# Patient Record
Sex: Female | Born: 1985
Health system: Southern US, Community
[De-identification: ages and names within clinical notes are randomized; demographics above are authoritative.]

## PROBLEM LIST (undated history)

## (undated) ENCOUNTER — Inpatient Hospital Stay (HOSPITAL_COMMUNITY): Payer: Self-pay

## (undated) DIAGNOSIS — T8859XA Other complications of anesthesia, initial encounter: Secondary | ICD-10-CM

## (undated) DIAGNOSIS — O24419 Gestational diabetes mellitus in pregnancy, unspecified control: Secondary | ICD-10-CM

## (undated) DIAGNOSIS — F329 Major depressive disorder, single episode, unspecified: Secondary | ICD-10-CM

## (undated) DIAGNOSIS — F419 Anxiety disorder, unspecified: Secondary | ICD-10-CM

## (undated) DIAGNOSIS — Z8489 Family history of other specified conditions: Secondary | ICD-10-CM

## (undated) DIAGNOSIS — T4145XA Adverse effect of unspecified anesthetic, initial encounter: Secondary | ICD-10-CM

## (undated) DIAGNOSIS — N159 Renal tubulo-interstitial disease, unspecified: Secondary | ICD-10-CM

## (undated) DIAGNOSIS — F32A Depression, unspecified: Secondary | ICD-10-CM

## (undated) HISTORY — PX: MOUTH SURGERY: SHX715

## (undated) HISTORY — DX: Depression, unspecified: F32.A

## (undated) HISTORY — DX: Major depressive disorder, single episode, unspecified: F32.9

## (undated) HISTORY — PX: DILATION AND CURETTAGE OF UTERUS: SHX78

---
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
## 2009-04-13 ENCOUNTER — Inpatient Hospital Stay (HOSPITAL_COMMUNITY): Admission: AD | Admit: 2009-04-13 | Discharge: 2009-04-16 | Payer: Self-pay | Admitting: Obstetrics and Gynecology

## 2009-04-23 ENCOUNTER — Inpatient Hospital Stay (HOSPITAL_COMMUNITY): Admission: AD | Admit: 2009-04-23 | Discharge: 2009-04-23 | Payer: Self-pay | Admitting: Obstetrics and Gynecology

## 2009-04-26 ENCOUNTER — Inpatient Hospital Stay (HOSPITAL_COMMUNITY): Admission: AD | Admit: 2009-04-26 | Discharge: 2009-04-27 | Payer: Self-pay | Admitting: Obstetrics and Gynecology

## 2009-04-27 ENCOUNTER — Encounter: Payer: Self-pay | Admitting: Obstetrics and Gynecology

## 2009-04-28 ENCOUNTER — Inpatient Hospital Stay (HOSPITAL_COMMUNITY): Admission: AD | Admit: 2009-04-28 | Discharge: 2009-05-04 | Payer: Self-pay | Admitting: Obstetrics & Gynecology

## 2009-04-30 ENCOUNTER — Encounter (INDEPENDENT_AMBULATORY_CARE_PROVIDER_SITE_OTHER): Payer: Self-pay | Admitting: Obstetrics & Gynecology

## 2010-05-25 ENCOUNTER — Emergency Department (HOSPITAL_COMMUNITY)
Admission: EM | Admit: 2010-05-25 | Discharge: 2010-05-25 | Payer: Self-pay | Source: Home / Self Care | Admitting: Family Medicine

## 2010-08-20 LAB — CBC
HCT: 28.1 % — ABNORMAL LOW (ref 36.0–46.0)
HCT: 28.7 % — ABNORMAL LOW (ref 36.0–46.0)
HCT: 37 % (ref 36.0–46.0)
Hemoglobin: 12.6 g/dL (ref 12.0–15.0)
Hemoglobin: 13.3 g/dL (ref 12.0–15.0)
Hemoglobin: 9.7 g/dL — ABNORMAL LOW (ref 12.0–15.0)
Hemoglobin: 9.9 g/dL — ABNORMAL LOW (ref 12.0–15.0)
MCHC: 33.7 g/dL (ref 30.0–36.0)
MCHC: 34.4 g/dL (ref 30.0–36.0)
MCHC: 34.6 g/dL (ref 30.0–36.0)
MCV: 91.9 fL (ref 78.0–100.0)
MCV: 92.3 fL (ref 78.0–100.0)
MCV: 92.6 fL (ref 78.0–100.0)
MCV: 92.7 fL (ref 78.0–100.0)
Platelets: 100 10*3/uL — ABNORMAL LOW (ref 150–400)
Platelets: 146 10*3/uL — ABNORMAL LOW (ref 150–400)
RBC: 3.04 MIL/uL — ABNORMAL LOW (ref 3.87–5.11)
RBC: 4.03 MIL/uL (ref 3.87–5.11)
RDW: 13.5 % (ref 11.5–15.5)
RDW: 13.8 % (ref 11.5–15.5)
RDW: 14.1 % (ref 11.5–15.5)
WBC: 11.9 10*3/uL — ABNORMAL HIGH (ref 4.0–10.5)

## 2010-08-20 LAB — PROTIME-INR: INR: 1.08 (ref 0.00–1.49)

## 2010-08-20 LAB — RAPID URINE DRUG SCREEN, HOSP PERFORMED
Cocaine: NOT DETECTED
Tetrahydrocannabinol: NOT DETECTED

## 2010-08-20 LAB — DIFFERENTIAL
Basophils Absolute: 0 10*3/uL (ref 0.0–0.1)
Basophils Relative: 0 % (ref 0–1)
Eosinophils Absolute: 0.1 10*3/uL (ref 0.0–0.7)
Monocytes Absolute: 0.7 10*3/uL (ref 0.1–1.0)
Monocytes Relative: 6 % (ref 3–12)
Neutro Abs: 9.3 10*3/uL — ABNORMAL HIGH (ref 1.7–7.7)

## 2010-08-21 LAB — RPR: RPR Ser Ql: NONREACTIVE

## 2010-08-21 LAB — CBC
HCT: 35.3 % — ABNORMAL LOW (ref 36.0–46.0)
MCHC: 34.2 g/dL (ref 30.0–36.0)
MCV: 92.3 fL (ref 78.0–100.0)
Platelets: 137 10*3/uL — ABNORMAL LOW (ref 150–400)
RBC: 4.02 MIL/uL (ref 3.87–5.11)
RDW: 13.5 % (ref 11.5–15.5)
RDW: 13.6 % (ref 11.5–15.5)
WBC: 9.4 10*3/uL (ref 4.0–10.5)

## 2011-02-01 ENCOUNTER — Inpatient Hospital Stay (INDEPENDENT_AMBULATORY_CARE_PROVIDER_SITE_OTHER)
Admission: RE | Admit: 2011-02-01 | Discharge: 2011-02-01 | Disposition: A | Discharge status: Home / Self Care | Payer: 59 | Source: Ambulatory Visit | Attending: Emergency Medicine | Admitting: Emergency Medicine

## 2011-02-01 DIAGNOSIS — N390 Urinary tract infection, site not specified: Secondary | ICD-10-CM

## 2011-02-01 LAB — POCT URINALYSIS DIP (DEVICE)
Glucose, UA: NEGATIVE mg/dL
Ketones, ur: NEGATIVE mg/dL
Protein, ur: 100 mg/dL — AB
Urobilinogen, UA: 1 mg/dL (ref 0.0–1.0)

## 2011-02-01 LAB — POCT PREGNANCY, URINE: Preg Test, Ur: NEGATIVE

## 2012-08-13 ENCOUNTER — Emergency Department (HOSPITAL_BASED_OUTPATIENT_CLINIC_OR_DEPARTMENT_OTHER)
Admission: EM | Admit: 2012-08-13 | Discharge: 2012-08-13 | Disposition: A | Discharge status: Home / Self Care | Payer: 59 | Attending: Emergency Medicine | Admitting: Emergency Medicine

## 2012-08-13 ENCOUNTER — Encounter (HOSPITAL_BASED_OUTPATIENT_CLINIC_OR_DEPARTMENT_OTHER): Payer: Self-pay

## 2012-08-13 DIAGNOSIS — W278XXA Contact with other nonpowered hand tool, initial encounter: Secondary | ICD-10-CM | POA: Insufficient documentation

## 2012-08-13 DIAGNOSIS — Y9389 Activity, other specified: Secondary | ICD-10-CM | POA: Insufficient documentation

## 2012-08-13 DIAGNOSIS — S61219A Laceration without foreign body of unspecified finger without damage to nail, initial encounter: Secondary | ICD-10-CM

## 2012-08-13 DIAGNOSIS — S61209A Unspecified open wound of unspecified finger without damage to nail, initial encounter: Secondary | ICD-10-CM | POA: Insufficient documentation

## 2012-08-13 DIAGNOSIS — Y929 Unspecified place or not applicable: Secondary | ICD-10-CM | POA: Insufficient documentation

## 2012-08-13 DIAGNOSIS — Z79899 Other long term (current) drug therapy: Secondary | ICD-10-CM | POA: Insufficient documentation

## 2012-08-13 NOTE — ED Provider Notes (Signed)
History     CSN: [id number]  Arrival date & time 08/13/12  1559   First MD Initiated Contact with Patient 08/13/12 1721      Chief Complaint  Patient presents with  . Finger Injury    (Consider location/radiation/quality/duration/timing/severity/associated sxs/prior treatment) Patient is a 27 y.o. female presenting with hand pain. The history is provided by the patient. No language interpreter was used.  Hand Pain This is a new problem. The current episode started today. The problem occurs constantly. The problem has been unchanged. Nothing aggravates the symptoms. She has tried nothing for the symptoms. The treatment provided no relief.  Pt has a v shaped flap to left index finger.   Pt reports her toddler reached for scizzors and she grabbed them  History reviewed. No pertinent past medical history.  Past Surgical History  Procedure Laterality Date  . Dilation and curettage of uterus    . Cesarean section    . Mouth surgery      History reviewed. No pertinent family history.  History  Substance Use Topics  . Smoking status: Never Smoker   . Smokeless tobacco: Never Used  . Alcohol Use: No    OB History   Grav Para Term Preterm Abortions TAB SAB Ect Mult Living                  Review of Systems  Skin: Positive for wound.  All other systems reviewed and are negative.    Allergies  Review of patient's allergies indicates no known allergies.  Home Medications   Current Outpatient Rx  Name  Route  Sig  Dispense  Refill  . FLUoxetine (PROZAC) 10 MG tablet   Oral   Take 10 mg by mouth daily.         Marland Kitchen levonorgestrel-ethinyl estradiol (NORDETTE) 0.15-30 MG-MCG tablet   Oral   Take 1 tablet by mouth daily.           BP 124/82  Pulse 81  Temp(Src) 98 F (36.7 C) (Oral)  Resp 16  Ht 5\' 5"  (1.651 m)  Wt 120 lb (54.432 kg)  BMI 19.97 kg/m2  SpO2 100%  LMP 07/30/2012  Physical Exam  Nursing note and vitals reviewed. Constitutional: She is  oriented to person, place, and time. She appears well-developed and well-nourished.  Musculoskeletal: She exhibits tenderness.  V shaped flap laceration distal left index finger,   Well approximated  From nv intact  Neurological: She is alert and oriented to person, place, and time. She has normal reflexes.  Skin: Skin is warm.  Psychiatric: She has a normal mood and affect.    ED Course  LACERATION REPAIR Date/Time: 08/13/2012 6:08 PM Performed by: Elson Areas Authorized by: Elson Areas Consent: Verbal consent not obtained. Risks and benefits: risks, benefits and alternatives were discussed Consent given by: patient Patient understanding: patient states understanding of the procedure being performed Required items: required blood products, implants, devices, and special equipment available Patient identity confirmed: verbally with patient Foreign bodies: no foreign bodies Irrigation solution: saline Amount of cleaning: standard Skin closure: glue Patient tolerance: Patient tolerated the procedure well with no immediate complications.   (including critical care time)  Labs Reviewed - No data to display No results found.   1. Laceration of finger of right hand, initial encounter       MDM   Pt placed in a finger splint.       Lonia Skinner Hawthorne, PA-C 08/13/12 [phone]

## 2012-08-13 NOTE — ED Notes (Signed)
Pt states that she was trying to take away some scissors from her toddler when she suffered a laceration to her L index finger.  Bleeding controlled.

## 2012-08-13 NOTE — ED Provider Notes (Signed)
Medical screening examination/treatment/procedure(s) were performed by non-physician practitioner and as supervising physician I was immediately available for consultation/collaboration.    Celene Kras, MD 08/13/12 [phone]

## 2012-09-10 ENCOUNTER — Telehealth: Payer: Self-pay | Admitting: Physician Assistant

## 2012-09-10 NOTE — Telephone Encounter (Signed)
PT DECIDED TO WAIT.NO APPT NEEDED AT THIS TIME

## 2012-09-14 ENCOUNTER — Ambulatory Visit (INDEPENDENT_AMBULATORY_CARE_PROVIDER_SITE_OTHER): Payer: 59 | Admitting: General Practice

## 2012-09-14 ENCOUNTER — Encounter: Payer: Self-pay | Admitting: General Practice

## 2012-09-14 ENCOUNTER — Telehealth: Payer: Self-pay | Admitting: Physician Assistant

## 2012-09-14 ENCOUNTER — Other Ambulatory Visit: Payer: Self-pay | Admitting: General Practice

## 2012-09-14 VITALS — BP 111/68 | HR 96 | Temp 97.6°F | Ht 65.0 in | Wt 125.0 lb

## 2012-09-14 DIAGNOSIS — H109 Unspecified conjunctivitis: Secondary | ICD-10-CM

## 2012-09-14 DIAGNOSIS — J020 Streptococcal pharyngitis: Secondary | ICD-10-CM

## 2012-09-14 DIAGNOSIS — J029 Acute pharyngitis, unspecified: Secondary | ICD-10-CM

## 2012-09-14 LAB — POCT RAPID STREP A (OFFICE): Rapid Strep A Screen: NEGATIVE

## 2012-09-14 MED ORDER — CIPROFLOXACIN HCL 0.3 % OP SOLN: 1.0000 [drp] | OPHTHALMIC | Status: DC

## 2012-09-14 MED ORDER — AMOXICILLIN 500 MG PO CAPS: 500.0000 mg | ORAL_CAPSULE | Freq: Two times a day (BID) | ORAL | Status: DC

## 2012-09-14 NOTE — Progress Notes (Signed)
  Subjective:    Patient ID: Amy Jackson, female    DOB: 26-Jun-1985, 27 y.o.   MRN: [id number]  Conjunctivitis  The current episode started 5 to 7 days ago. The onset was sudden. The problem occurs continuously. The problem has been gradually improving. The problem is moderate. Nothing relieves the symptoms. Nothing aggravates the symptoms. Associated symptoms include eye itching, ear pain, rhinorrhea, sore throat, muscle aches, cough, eye discharge and eye redness. Pertinent negatives include no fever, no decreased vision, no double vision and no ear discharge. There is no color change associated with the cough. Cough worsened by: in the morning. She has been experiencing a mild sore throat. Neither side is more painful than the other. The sore throat is characterized by pain only. She has been behaving normally. She has been eating and drinking normally. Urine output has been normal. There were sick contacts at work.      Review of Systems  Constitutional: Negative for fever.  HENT: Positive for ear pain, sore throat and rhinorrhea. Negative for ear discharge.   Eyes: Positive for discharge, redness and itching. Negative for double vision.  Respiratory: Positive for cough.   Cardiovascular: Negative for chest pain and palpitations.  Genitourinary: Negative for dysuria and difficulty urinating.       Objective:   Physical Exam  Constitutional: She is oriented to person, place, and time. She appears well-developed and well-nourished.  HENT:  Nose: Right sinus exhibits no maxillary sinus tenderness and no frontal sinus tenderness. Left sinus exhibits no maxillary sinus tenderness and no frontal sinus tenderness.  Mouth/Throat: Posterior oropharyngeal erythema present.  Eyes: Left conjunctiva is injected.  Cardiovascular: Normal rate, regular rhythm and normal heart sounds.   No murmur heard. Pulmonary/Chest: Effort normal and breath sounds normal. No respiratory distress. She exhibits  no tenderness.  Neurological: She is alert and oriented to person, place, and time.  Skin: Skin is warm and dry.  Psychiatric: She has a normal mood and affect.          Assessment & Plan:  Use medications as prescribed Precautions discussed to prevent spreading Increase fluid intake Motrin or tylenol OTC OTC decongestant Throat lozenges if help New toothbrush in 3 days Proper handwashing Patient verbalized understanding Raymon Mutton, FNP-C

## 2012-09-14 NOTE — Patient Instructions (Addendum)
Conjunctivitis Conjunctivitis is commonly called "pink eye." Conjunctivitis can be caused by bacterial or viral infection, allergies, or injuries. There is usually redness of the lining of the eye, itching, discomfort, and sometimes discharge. There may be deposits of matter along the eyelids. A viral infection usually causes a watery discharge, while a bacterial infection causes a yellowish, thick discharge. Pink eye is very contagious and spreads by direct contact. You may be given antibiotic eyedrops as part of your treatment. Before using your eye medicine, remove all drainage from the eye by washing gently with warm water and cotton balls. Continue to use the medication until you have awakened 2 mornings in a row without discharge from the eye. Do not rub your eye. This increases the irritation and helps spread infection. Use separate towels from other household members. Wash your hands with soap and water before and after touching your eyes. Use cold compresses to reduce pain and sunglasses to relieve irritation from light. Do not wear contact lenses or wear eye makeup until the infection is gone. SEEK MEDICAL CARE IF:   Your symptoms are not better after 3 days of treatment.  You have increased pain or trouble seeing.  The outer eyelids become very red or swollen. Document Released: 06/12/2004 Document Revised: 07/28/2011 Document Reviewed: 05/05/2005 [facility] Patient Information 2013 [place], [place]. Sore Throat Sore throats may be caused by bacteria and viruses. They may also be caused by:  Smoking.  Pollution.  Allergies. If a sore throat is due to strep infection (a bacterial infection), you may need:  A throat swab.  A culture test to verify the strep infection. You will need one of these:  An antibiotic shot.  Oral medicine for a full 10 days. Strep infection is very contagious. A doctor should check any close contacts who have a sore throat or fever. A sore throat caused  by a virus infection will usually last only 3-4 days. Antibiotics will not treat a viral sore throat.  Infectious mononucleosis (a viral disease), however, can cause a sore throat that lasts for up to 3 weeks. Mononucleosis can be diagnosed with blood tests. You must have been sick for at least 1 week in order for the test to give accurate results. HOME CARE INSTRUCTIONS   To treat a sore throat, take mild pain medicine.  Increase your fluids.  Eat a soft diet.  Do not smoke.  Gargling with warm water or salt water (1 tsp. salt in 8 oz. water) can be helpful.  Try throat sprays or lozenges or sucking on hard candy to ease the symptoms. Call your doctor if your sore throat lasts longer than 1 week.  SEEK IMMEDIATE MEDICAL CARE IF:  You have difficulty breathing.  You have increased swelling in the throat.  You have pain so severe that you are unable to swallow fluids or your saliva.  You have a severe headache, a high fever, vomiting, or a red rash. Document Released: 06/12/2004 Document Revised: 07/28/2011 Document Reviewed: 04/22/2007 [facility] Patient Information 2013 [place], [place].

## 2012-09-14 NOTE — Telephone Encounter (Signed)
APPT MADE

## 2013-02-21 ENCOUNTER — Other Ambulatory Visit: Payer: Self-pay | Admitting: Obstetrics and Gynecology

## 2013-03-18 ENCOUNTER — Ambulatory Visit: Payer: 59 | Admitting: Family Medicine

## 2013-04-08 ENCOUNTER — Ambulatory Visit: Payer: 59 | Admitting: Family Medicine

## 2013-05-17 ENCOUNTER — Emergency Department (HOSPITAL_COMMUNITY)
Admission: EM | Admit: 2013-05-17 | Discharge: 2013-05-17 | Disposition: A | Discharge status: Home / Self Care | Payer: 59 | Source: Home / Self Care | Attending: Family Medicine | Admitting: Family Medicine

## 2013-05-17 ENCOUNTER — Encounter (HOSPITAL_COMMUNITY): Payer: Self-pay | Admitting: Emergency Medicine

## 2013-05-17 DIAGNOSIS — J039 Acute tonsillitis, unspecified: Secondary | ICD-10-CM

## 2013-05-17 LAB — POCT RAPID STREP A: Streptococcus, Group A Screen (Direct): NEGATIVE

## 2013-05-17 MED ORDER — AMOXICILLIN-POT CLAVULANATE 875-125 MG PO TABS: 1.0000 | ORAL_TABLET | Freq: Two times a day (BID) | ORAL | Status: DC

## 2013-05-17 NOTE — ED Provider Notes (Signed)
Amy Jackson is a 27 y.o. female who presents to Urgent Care today for 3 days of sore throat associated with white plaques. Pain is moderate. Patient has not tried any medications yet. She notes a mild runny nose but denies any cough or congestion. No nausea vomiting or diarrhea   Past Medical History  Diagnosis Date  . Depression    History  Substance Use Topics  . Smoking status: Never Smoker   . Smokeless tobacco: Never Used  . Alcohol Use: No   ROS as above Medications reviewed. No current facility-administered medications for this encounter.   Current Outpatient Prescriptions  Medication Sig Dispense Refill  . amoxicillin-clavulanate (AUGMENTIN) 875-125 MG per tablet Take 1 tablet by mouth every 12 (twelve) hours.  14 tablet  0  . FLUoxetine (PROZAC) 10 MG tablet Take 10 mg by mouth daily.      Marland Kitchen levonorgestrel-ethinyl estradiol (NORDETTE) 0.15-30 MG-MCG tablet Take 1 tablet by mouth daily.        Exam:  BP 123/76  Pulse 93  Temp(Src) 98 F (36.7 C) (Oral)  Resp 18  SpO2 100% Gen: Well NAD HEENT: EOMI,  MMM left tonsil with exudate and inflammation. Slight deviation of the uvula. No trismus or hot potato voice present.  No significant lymphadenopathy. Lungs: Normal work of breathing. CTABL Heart: RRR no MRG Abd: NABS, Soft. NT, ND Exts: , warm and well perfused.   Results for orders placed during the hospital encounter of 05/17/13 (from the past 24 hour(s))  POCT RAPID STREP A ([facility] URG CARE ONLY)     Status: None   Collection Time    05/17/13  5:46 PM      Result Value Range   Streptococcus, Group A Screen (Direct) NEGATIVE  NEGATIVE   No results found.  Assessment and Plan: 27 y.o. female with tonsillitis. Patient may have very early peritonsillar abscess. He does not appear to be drainable and patient does not have any concerning physical exam findings such as complete deviation of the uvula, or trismus or hot potato voice or difficulty breathing. Plan to  treat with Augmentin. Recommend followup with [facility] ear nose and throat if not improving. Present to the emergency room for worsening. Discussed warning signs or symptoms. Please see discharge instructions. Patient expresses understanding.       Rodolph Bong, MD 05/17/13 1910

## 2013-05-17 NOTE — ED Notes (Signed)
Sore throat. Tonsils reddened, swollen, uvula deviated to left side ; NAD

## 2013-05-19 LAB — CULTURE, GROUP A STREP

## 2013-07-20 ENCOUNTER — Telehealth: Payer: Self-pay | Admitting: Family Medicine

## 2013-07-20 NOTE — Telephone Encounter (Signed)
Pruritic rash on abd and sinusitis.  Appt scheduled for tomorrow. Patient aware.

## 2013-07-21 ENCOUNTER — Encounter: Payer: Self-pay | Admitting: Nurse Practitioner

## 2013-07-21 ENCOUNTER — Ambulatory Visit (INDEPENDENT_AMBULATORY_CARE_PROVIDER_SITE_OTHER): Payer: 59 | Admitting: Nurse Practitioner

## 2013-07-21 VITALS — BP 115/65 | HR 94 | Temp 98.6°F | Ht 65.0 in | Wt 134.0 lb

## 2013-07-21 DIAGNOSIS — K143 Hypertrophy of tongue papillae: Secondary | ICD-10-CM

## 2013-07-21 DIAGNOSIS — L259 Unspecified contact dermatitis, unspecified cause: Secondary | ICD-10-CM

## 2013-07-21 DIAGNOSIS — B370 Candidal stomatitis: Secondary | ICD-10-CM

## 2013-07-21 DIAGNOSIS — L309 Dermatitis, unspecified: Secondary | ICD-10-CM

## 2013-07-21 LAB — POCT WET PREP (WET MOUNT)
KOH WET PREP POC: POSITIVE
TRICHOMONAS WET PREP HPF POC: NEGATIVE
WBC, Wet Prep HPF POC: NEGATIVE

## 2013-07-21 MED ORDER — NYSTATIN 100000 UNIT/ML MT SUSP: 5.0000 mL | Freq: Four times a day (QID) | OROMUCOSAL | Status: DC

## 2013-07-21 MED ORDER — PREDNISONE 20 MG PO TABS: ORAL_TABLET | ORAL | Status: DC

## 2013-07-21 MED ORDER — PREDNISONE 20 MG PO TABS: 20.0000 mg | ORAL_TABLET | Freq: Every day | ORAL | Status: DC

## 2013-07-21 NOTE — Patient Instructions (Signed)
.   Tongue coating - POCT Wet Prep [facility]([place])  2. Oral pharyngeal candidiasis COntinue to brush togue at least BID - nystatin (MYCOSTATIN) 100000 UNIT/ML suspension; Take 5 mLs (500,000 Units total) by mouth 4 (four) times daily.  Dispense: 60 mL; Refill: 0  3. Dermatitis Avoid scratching Benadryl OTC for itching - predniSONE (DELTASONE) 20 MG tablet; 2 po qd X5 days  Dispense: 10 tablet; Refill: 0   Follow up prn  Mary-Margaret Daphine DeutscherMartin, FNP

## 2013-07-21 NOTE — Progress Notes (Signed)
   Subjective:    Patient ID: Amy Jackson, female    DOB: 1985-06-06, 28 y.o.   MRN: [id number]  HPI Patient in today c/o cough and congestion  That starte d 1-2 weeks ago- Has been taking OTC meds tHat has helped some- Developed a rash 2-3 days ado- very itchy    Review of Systems  Constitutional: Negative for fever and chills.  HENT: Positive for congestion and sore throat. Negative for ear pain, trouble swallowing and voice change.   Respiratory: Positive for cough (slight).   Skin: Positive for rash (around abdomen).       Objective:   Physical Exam  Constitutional: She is oriented to person, place, and time. She appears well-developed and well-nourished.  HENT:  Right Ear: External ear normal.  Left Ear: External ear normal.  Nose: Nose normal.  Mouth/Throat: Oropharynx is clear and moist.  Eyes: Pupils are equal, round, and reactive to light.  Neck: Normal range of motion. Neck supple.  Cardiovascular: Normal rate and normal heart sounds.   Pulmonary/Chest: Effort normal and breath sounds normal.  Abdominal: Soft. Bowel sounds are normal.  Lymphadenopathy:    She has no cervical adenopathy.  Neurological: She is alert and oriented to person, place, and time.  Skin: Skin is warm and dry. Rash (patchy dry rach circumfrencial around mid abdomen) noted.  Psychiatric: She has a normal mood and affect. Her behavior is normal. Judgment and thought content normal.          Assessment & Plan:  1. Tongue coating - POCT Wet Prep [facility]([place] [place])  2. Oral pharyngeal candidiasis COntinue to brush togue at least BID - nystatin (MYCOSTATIN) 100000 UNIT/ML suspension; Take 5 mLs (500,000 Units total) by mouth 4 (four) times daily.  Dispense: 60 mL; Refill: 0  3. Dermatitis Avoid scratching Benadryl OTC for itching - predniSONE (DELTASONE) 20 MG tablet; 2 po qd X5 days  Dispense: 10 tablet; Refill: 0   Follow up prn  Mary-Margaret Daphine DeutscherMartin, FNP

## 2013-07-25 ENCOUNTER — Telehealth: Payer: Self-pay | Admitting: Nurse Practitioner

## 2013-07-25 ENCOUNTER — Telehealth: Payer: Self-pay | Admitting: *Deleted

## 2013-07-25 MED ORDER — AZITHROMYCIN 1 G PO PACK: 1.0000 g | PACK | Freq: Once | ORAL | Status: DC

## 2013-07-25 NOTE — Telephone Encounter (Signed)
Left message on pt cell phone to take meds as directed and call with results

## 2013-07-25 NOTE — Telephone Encounter (Signed)
Have patient see dentist.

## 2013-07-25 NOTE — Telephone Encounter (Signed)
Please advise 

## 2013-07-25 NOTE — Telephone Encounter (Signed)
Sent in zithromax only take 2 tabkets then let me know how tongue does.

## 2013-07-25 NOTE — Telephone Encounter (Signed)
Left message on pt cell phone number that was left-- advising her to contac her dentist and to call if questions

## 2013-07-25 NOTE — Telephone Encounter (Signed)
Patient called back and stated that she would be happy to see a dentist but was wondering if you would try something else first. States that yall had discussed possible oral STD and wants to know if you will treat her for that first and then if that doesn't work then she will see the dentist. Please advise. When call is returned please leave a detailed message on voicemail because she is at work and may not be able to answer

## 2014-05-25 ENCOUNTER — Ambulatory Visit (INDEPENDENT_AMBULATORY_CARE_PROVIDER_SITE_OTHER): Payer: 59 | Admitting: Family Medicine

## 2014-05-25 ENCOUNTER — Encounter: Payer: Self-pay | Admitting: Family Medicine

## 2014-05-25 VITALS — BP 110/67 | HR 90 | Temp 97.8°F | Ht 65.0 in | Wt 129.4 lb

## 2014-05-25 DIAGNOSIS — F411 Generalized anxiety disorder: Secondary | ICD-10-CM

## 2014-05-25 DIAGNOSIS — B370 Candidal stomatitis: Secondary | ICD-10-CM

## 2014-05-25 MED ORDER — FLUOXETINE HCL 20 MG PO TABS: 20.0000 mg | ORAL_TABLET | Freq: Every day | ORAL | Status: DC

## 2014-05-25 MED ORDER — FLUCONAZOLE 150 MG PO TABS: 150.0000 mg | ORAL_TABLET | Freq: Once | ORAL | Status: DC

## 2014-05-25 MED ORDER — NYSTATIN 100000 UNIT/ML MT SUSP: 5.0000 mL | Freq: Four times a day (QID) | OROMUCOSAL | Status: DC

## 2014-05-25 NOTE — Progress Notes (Signed)
   Subjective:    Patient ID: Amy Jackson, female    DOB: 02-Dec-1985, 29 y.o.   MRN: [id number]  HPI Patient states she took an abx 8 months ago and she has been having persistent oral thrush.  She has been having panic and anxiety and wants to get back on her prozac.  Review of Systems  Constitutional: Negative for fever.  HENT: Negative for ear pain.   Eyes: Negative for discharge.  Respiratory: Negative for cough.   Cardiovascular: Negative for chest pain.  Gastrointestinal: Negative for abdominal distention.  Endocrine: Negative for polyuria.  Genitourinary: Negative for difficulty urinating.  Musculoskeletal: Negative for gait problem and neck pain.  Skin: Negative for color change and rash.  Neurological: Negative for speech difficulty and headaches.  Psychiatric/Behavioral: Negative for agitation.       Objective:    BP 110/67 mmHg  Pulse 90  Temp(Src) 97.8 F (36.6 C) (Oral)  Ht 5\' 5"  (1.651 m)  Wt 129 lb 6 oz (58.684 kg)  BMI 21.53 kg/m2  LMP 05/19/2014 (Approximate) Physical Exam  Constitutional: She is oriented to person, place, and time. She appears well-developed and well-nourished.  HENT:  Head: Normocephalic and atraumatic.  Mouth/Throat: Oropharynx is clear and moist.  Eyes: Pupils are equal, round, and reactive to light.  Neck: Normal range of motion. Neck supple.  Cardiovascular: Normal rate and regular rhythm.   No murmur heard. Pulmonary/Chest: Effort normal and breath sounds normal.  Abdominal: Soft. Bowel sounds are normal. There is no tenderness.  Neurological: She is alert and oriented to person, place, and time.  Skin: Skin is warm and dry.  Psychiatric: She has a normal mood and affect.          Assessment & Plan:     ICD-9-CM ICD-10-CM   1. Oral pharyngeal candidiasis 112.0 B37.0 fluconazole (DIFLUCAN) 150 MG tablet     nystatin (MYCOSTATIN) 100000 UNIT/ML suspension  2. GAD (generalized anxiety disorder) 300.02 F41.1 FLUoxetine  (PROZAC) 20 MG tablet     No Follow-up on file.  Deatra CanterWilliam J Oxford FNP

## 2014-06-01 ENCOUNTER — Ambulatory Visit: Payer: 59 | Admitting: Family Medicine

## 2014-08-07 LAB — OB RESULTS CONSOLE ABO/RH: RH Type: POSITIVE

## 2014-08-07 LAB — OB RESULTS CONSOLE RUBELLA ANTIBODY, IGM: Rubella: NON-IMMUNE/NOT IMMUNE

## 2014-08-07 LAB — OB RESULTS CONSOLE HIV ANTIBODY (ROUTINE TESTING): HIV: NONREACTIVE

## 2014-08-07 LAB — OB RESULTS CONSOLE ANTIBODY SCREEN: ANTIBODY SCREEN: NEGATIVE

## 2014-08-07 LAB — OB RESULTS CONSOLE RPR: RPR: NONREACTIVE

## 2014-11-22 LAB — OB RESULTS CONSOLE RPR: RPR: NONREACTIVE

## 2015-01-30 ENCOUNTER — Other Ambulatory Visit: Payer: Self-pay | Admitting: Obstetrics and Gynecology

## 2015-02-13 ENCOUNTER — Encounter (HOSPITAL_COMMUNITY): Payer: Self-pay

## 2015-02-14 ENCOUNTER — Encounter (HOSPITAL_COMMUNITY)
Admission: RE | Admit: 2015-02-14 | Discharge: 2015-02-14 | Disposition: A | Discharge status: Home / Self Care | Payer: 59 | Source: Ambulatory Visit | Attending: Obstetrics and Gynecology | Admitting: Obstetrics and Gynecology

## 2015-02-14 ENCOUNTER — Encounter (HOSPITAL_COMMUNITY): Payer: Self-pay

## 2015-02-14 HISTORY — DX: Family history of other specified conditions: Z84.89

## 2015-02-14 HISTORY — DX: Other complications of anesthesia, initial encounter: T88.59XA

## 2015-02-14 HISTORY — DX: Adverse effect of unspecified anesthetic, initial encounter: T41.45XA

## 2015-02-14 LAB — CBC
HCT: 34.3 % — ABNORMAL LOW (ref 36.0–46.0)
HEMOGLOBIN: 11.5 g/dL — AB (ref 12.0–15.0)
MCH: 27.7 pg (ref 26.0–34.0)
MCHC: 33.5 g/dL (ref 30.0–36.0)
MCV: 82.7 fL (ref 78.0–100.0)
PLATELETS: 150 10*3/uL (ref 150–400)
RBC: 4.15 MIL/uL (ref 3.87–5.11)
RDW: 14.7 % (ref 11.5–15.5)
WBC: 6.7 10*3/uL (ref 4.0–10.5)

## 2015-02-14 LAB — ABO/RH: ABO/RH(D): A POS

## 2015-02-14 LAB — TYPE AND SCREEN
ABO/RH(D): A POS
Antibody Screen: NEGATIVE

## 2015-02-14 NOTE — Patient Instructions (Signed)
Your procedure is scheduled on:02/16/15  Enter through the Main Entrance at :1030 am Pick up desk phone and dial [phone] and inform us of your arrival.  Please call [phone] if you have any problems the morning of surgery.  Remember: Do not eat food after midnight:Thursday Clear liquids are ok until:8am Friday   You may brush your teeth the morning of surgery.   DO NOT wear jewelry, eye make-up, lipstick,body lotion, or dark fingernail polish.  (Polished toes are ok) You may wear deodorant.  If you are to be admitted after surgery, leave suitcase in car until your room has been assigned. Patients discharged on the day of surgery will not be allowed to drive home. Wear loose fitting, comfortable clothes for your ride home.

## 2015-02-15 LAB — RPR: RPR Ser Ql: NONREACTIVE

## 2015-02-16 ENCOUNTER — Encounter (HOSPITAL_COMMUNITY): Payer: Self-pay | Admitting: Anesthesiology

## 2015-02-16 ENCOUNTER — Inpatient Hospital Stay (HOSPITAL_COMMUNITY): Payer: 59 | Admitting: Anesthesiology

## 2015-02-16 ENCOUNTER — Encounter (HOSPITAL_COMMUNITY)
Admission: RE | Disposition: A | Discharge status: Home / Self Care | Payer: Self-pay | Source: Ambulatory Visit | Attending: Obstetrics and Gynecology

## 2015-02-16 ENCOUNTER — Inpatient Hospital Stay (HOSPITAL_COMMUNITY)
Admission: RE | Admit: 2015-02-16 | Discharge: 2015-02-18 | DRG: 765 | Disposition: A | Discharge status: Home / Self Care | Payer: 59 | Source: Ambulatory Visit | Attending: Obstetrics and Gynecology | Admitting: Obstetrics and Gynecology

## 2015-02-16 DIAGNOSIS — O9962 Diseases of the digestive system complicating childbirth: Secondary | ICD-10-CM | POA: Diagnosis present

## 2015-02-16 DIAGNOSIS — O9902 Anemia complicating childbirth: Secondary | ICD-10-CM | POA: Diagnosis present

## 2015-02-16 DIAGNOSIS — Z3A39 39 weeks gestation of pregnancy: Secondary | ICD-10-CM

## 2015-02-16 DIAGNOSIS — D649 Anemia, unspecified: Secondary | ICD-10-CM | POA: Diagnosis present

## 2015-02-16 DIAGNOSIS — K210 Gastro-esophageal reflux disease with esophagitis: Secondary | ICD-10-CM | POA: Diagnosis present

## 2015-02-16 DIAGNOSIS — O9852 Other viral diseases complicating childbirth: Secondary | ICD-10-CM | POA: Diagnosis present

## 2015-02-16 DIAGNOSIS — O34211 Maternal care for low transverse scar from previous cesarean delivery: Principal | ICD-10-CM | POA: Diagnosis present

## 2015-02-16 SURGERY — Surgical Case
Anesthesia: Spinal

## 2015-02-16 MED ORDER — LACTATED RINGERS IV SOLN: INTRAVENOUS | Status: DC

## 2015-02-16 MED ORDER — METOCLOPRAMIDE HCL 5 MG/ML IJ SOLN
10.0000 mg | Freq: Once | INTRAMUSCULAR | Status: AC
  Administered 2015-02-16: 10 mg via INTRAVENOUS

## 2015-02-16 MED ORDER — NALBUPHINE HCL 10 MG/ML IJ SOLN
5.0000 mg | Freq: Once | INTRAMUSCULAR | Status: DC | PRN
  Filled 2015-02-16: qty 0.5

## 2015-02-16 MED ORDER — ONDANSETRON HCL 4 MG/2ML IJ SOLN
INTRAMUSCULAR | Status: DC | PRN
  Administered 2015-02-16: 4 mg via INTRAVENOUS

## 2015-02-16 MED ORDER — SIMETHICONE 80 MG PO CHEW
80.0000 mg | CHEWABLE_TABLET | Freq: Three times a day (TID) | ORAL | Status: DC
  Administered 2015-02-16 – 2015-02-18 (×5): 80 mg via ORAL
  Filled 2015-02-16 (×4): qty 1

## 2015-02-16 MED ORDER — PRENATAL MULTIVITAMIN CH
1.0000 | ORAL_TABLET | Freq: Every day | ORAL | Status: DC
  Administered 2015-02-17 – 2015-02-18 (×2): 1 via ORAL
  Filled 2015-02-16 (×2): qty 1

## 2015-02-16 MED ORDER — NALBUPHINE HCL 10 MG/ML IJ SOLN
5.0000 mg | INTRAMUSCULAR | Status: DC | PRN
  Filled 2015-02-16: qty 0.5

## 2015-02-16 MED ORDER — LACTATED RINGERS IV SOLN
INTRAVENOUS | Status: DC
  Administered 2015-02-16: 23:00:00 via INTRAVENOUS

## 2015-02-16 MED ORDER — SENNOSIDES-DOCUSATE SODIUM 8.6-50 MG PO TABS
2.0000 | ORAL_TABLET | ORAL | Status: DC
  Administered 2015-02-16 – 2015-02-17 (×2): 2 via ORAL
  Filled 2015-02-16 (×2): qty 2

## 2015-02-16 MED ORDER — KETOROLAC TROMETHAMINE 30 MG/ML IJ SOLN
30.0000 mg | Freq: Four times a day (QID) | INTRAMUSCULAR | Status: AC | PRN
  Administered 2015-02-16: 30 mg via INTRAMUSCULAR

## 2015-02-16 MED ORDER — SIMETHICONE 80 MG PO CHEW: 80.0000 mg | CHEWABLE_TABLET | ORAL | Status: DC | PRN

## 2015-02-16 MED ORDER — MORPHINE SULFATE (PF) 4 MG/ML IV SOLN
4.0000 mg | Freq: Once | INTRAVENOUS | Status: AC
  Administered 2015-02-16: 4 mg via INTRAVENOUS
  Filled 2015-02-16: qty 1

## 2015-02-16 MED ORDER — SCOPOLAMINE 1 MG/3DAYS TD PT72
MEDICATED_PATCH | TRANSDERMAL | Status: DC
Start: 2015-02-16 — End: 2015-02-18
  Administered 2015-02-16: 1.5 mg via TRANSDERMAL
  Filled 2015-02-16: qty 1

## 2015-02-16 MED ORDER — NALBUPHINE HCL 10 MG/ML IJ SOLN
5.0000 mg | INTRAMUSCULAR | Status: DC | PRN
  Administered 2015-02-16 – 2015-02-17 (×2): 5 mg via SUBCUTANEOUS
  Filled 2015-02-16 (×3): qty 0.5

## 2015-02-16 MED ORDER — ONDANSETRON HCL 4 MG/2ML IJ SOLN
INTRAMUSCULAR | Status: AC
  Filled 2015-02-16: qty 2

## 2015-02-16 MED ORDER — OXYTOCIN 10 UNIT/ML IJ SOLN
INTRAMUSCULAR | Status: AC
  Filled 2015-02-16: qty 4

## 2015-02-16 MED ORDER — METOCLOPRAMIDE HCL 5 MG/ML IJ SOLN
INTRAMUSCULAR | Status: AC
Start: 2015-02-16 — End: 2015-02-17
  Filled 2015-02-16: qty 2

## 2015-02-16 MED ORDER — METHYLERGONOVINE MALEATE 0.2 MG PO TABS: 0.2000 mg | ORAL_TABLET | ORAL | Status: DC | PRN

## 2015-02-16 MED ORDER — CEFAZOLIN SODIUM-DEXTROSE 2-3 GM-% IV SOLR
2.0000 g | INTRAVENOUS | Status: AC
  Administered 2015-02-16: 2 g via INTRAVENOUS

## 2015-02-16 MED ORDER — NALOXONE HCL 0.4 MG/ML IJ SOLN: 0.4000 mg | INTRAMUSCULAR | Status: DC | PRN

## 2015-02-16 MED ORDER — OXYCODONE-ACETAMINOPHEN 5-325 MG PO TABS
1.0000 | ORAL_TABLET | ORAL | Status: DC | PRN
  Administered 2015-02-17 – 2015-02-18 (×3): 1 via ORAL
  Filled 2015-02-16 (×2): qty 1

## 2015-02-16 MED ORDER — NALBUPHINE HCL 10 MG/ML IJ SOLN
INTRAMUSCULAR | Status: AC
  Filled 2015-02-16: qty 1

## 2015-02-16 MED ORDER — LANOLIN HYDROUS EX OINT: 1.0000 "application " | TOPICAL_OINTMENT | CUTANEOUS | Status: DC | PRN

## 2015-02-16 MED ORDER — DIBUCAINE 1 % RE OINT: 1.0000 "application " | TOPICAL_OINTMENT | RECTAL | Status: DC | PRN

## 2015-02-16 MED ORDER — DIPHENHYDRAMINE HCL 25 MG PO CAPS: 25.0000 mg | ORAL_CAPSULE | ORAL | Status: DC | PRN

## 2015-02-16 MED ORDER — IBUPROFEN 600 MG PO TABS
600.0000 mg | ORAL_TABLET | Freq: Four times a day (QID) | ORAL | Status: DC
  Administered 2015-02-16 – 2015-02-18 (×5): 600 mg via ORAL
  Filled 2015-02-16 (×7): qty 1

## 2015-02-16 MED ORDER — LACTATED RINGERS IV SOLN
INTRAVENOUS | Status: DC | PRN
  Administered 2015-02-16: 12:00:00 via INTRAVENOUS

## 2015-02-16 MED ORDER — MORPHINE SULFATE (PF) 0.5 MG/ML IJ SOLN
INTRAMUSCULAR | Status: DC | PRN
  Administered 2015-02-16: .15 mg via INTRATHECAL

## 2015-02-16 MED ORDER — DIPHENHYDRAMINE HCL 25 MG PO CAPS: 25.0000 mg | ORAL_CAPSULE | Freq: Four times a day (QID) | ORAL | Status: DC | PRN

## 2015-02-16 MED ORDER — ZOLPIDEM TARTRATE 5 MG PO TABS: 5.0000 mg | ORAL_TABLET | Freq: Every evening | ORAL | Status: DC | PRN

## 2015-02-16 MED ORDER — METHYLERGONOVINE MALEATE 0.2 MG/ML IJ SOLN: 0.2000 mg | INTRAMUSCULAR | Status: DC | PRN

## 2015-02-16 MED ORDER — PHENYLEPHRINE 8 MG IN D5W 100 ML (0.08MG/ML) PREMIX OPTIME
INJECTION | INTRAVENOUS | Status: AC
  Filled 2015-02-16: qty 100

## 2015-02-16 MED ORDER — SCOPOLAMINE 1 MG/3DAYS TD PT72
1.0000 | MEDICATED_PATCH | Freq: Once | TRANSDERMAL | Status: DC
  Administered 2015-02-16: 1.5 mg via TRANSDERMAL

## 2015-02-16 MED ORDER — LACTATED RINGERS IV SOLN
INTRAVENOUS | Status: DC
  Administered 2015-02-16 (×2): via INTRAVENOUS

## 2015-02-16 MED ORDER — NALOXONE HCL 1 MG/ML IJ SOLN
1.0000 ug/kg/h | INTRAVENOUS | Status: DC | PRN
  Filled 2015-02-16: qty 2

## 2015-02-16 MED ORDER — KETOROLAC TROMETHAMINE 30 MG/ML IJ SOLN: 30.0000 mg | Freq: Four times a day (QID) | INTRAMUSCULAR | Status: AC | PRN

## 2015-02-16 MED ORDER — BUPIVACAINE IN DEXTROSE 0.75-8.25 % IT SOLN
INTRATHECAL | Status: DC | PRN
  Administered 2015-02-16: 1.5 mL via INTRATHECAL

## 2015-02-16 MED ORDER — ACETAMINOPHEN 325 MG PO TABS: 650.0000 mg | ORAL_TABLET | ORAL | Status: DC | PRN

## 2015-02-16 MED ORDER — DIPHENHYDRAMINE HCL 50 MG/ML IJ SOLN: 12.5000 mg | INTRAMUSCULAR | Status: DC | PRN

## 2015-02-16 MED ORDER — OXYCODONE-ACETAMINOPHEN 5-325 MG PO TABS
2.0000 | ORAL_TABLET | ORAL | Status: DC | PRN
  Administered 2015-02-17 (×3): 2 via ORAL
  Filled 2015-02-16 (×4): qty 2

## 2015-02-16 MED ORDER — ONDANSETRON HCL 4 MG/2ML IJ SOLN: 4.0000 mg | Freq: Three times a day (TID) | INTRAMUSCULAR | Status: DC | PRN

## 2015-02-16 MED ORDER — FENTANYL CITRATE (PF) 100 MCG/2ML IJ SOLN: 25.0000 ug | INTRAMUSCULAR | Status: DC | PRN

## 2015-02-16 MED ORDER — MORPHINE SULFATE (PF) 0.5 MG/ML IJ SOLN
INTRAMUSCULAR | Status: AC
  Filled 2015-02-16: qty 100

## 2015-02-16 MED ORDER — SODIUM CHLORIDE 0.9 % IV SOLN
10000.0000 ug | INTRAVENOUS | Status: DC | PRN
  Administered 2015-02-16: 60 ug/min via INTRAVENOUS

## 2015-02-16 MED ORDER — TETANUS-DIPHTH-ACELL PERTUSSIS 5-2.5-18.5 LF-MCG/0.5 IM SUSP: 0.5000 mL | Freq: Once | INTRAMUSCULAR | Status: DC

## 2015-02-16 MED ORDER — SODIUM CHLORIDE 0.9 % IJ SOLN: 3.0000 mL | INTRAMUSCULAR | Status: DC | PRN

## 2015-02-16 MED ORDER — SIMETHICONE 80 MG PO CHEW
80.0000 mg | CHEWABLE_TABLET | ORAL | Status: DC
  Administered 2015-02-17: 80 mg via ORAL
  Filled 2015-02-16 (×2): qty 1

## 2015-02-16 MED ORDER — IBUPROFEN 600 MG PO TABS
600.0000 mg | ORAL_TABLET | Freq: Four times a day (QID) | ORAL | Status: DC | PRN
  Administered 2015-02-17 – 2015-02-18 (×2): 600 mg via ORAL

## 2015-02-16 MED ORDER — FENTANYL CITRATE (PF) 100 MCG/2ML IJ SOLN
INTRAMUSCULAR | Status: DC | PRN
  Administered 2015-02-16: 25 ug via INTRATHECAL

## 2015-02-16 MED ORDER — FENTANYL CITRATE (PF) 100 MCG/2ML IJ SOLN
INTRAMUSCULAR | Status: AC
  Filled 2015-02-16: qty 4

## 2015-02-16 MED ORDER — MENTHOL 3 MG MT LOZG: 1.0000 | LOZENGE | OROMUCOSAL | Status: DC | PRN

## 2015-02-16 MED ORDER — KETOROLAC TROMETHAMINE 30 MG/ML IJ SOLN
INTRAMUSCULAR | Status: AC
  Filled 2015-02-16: qty 1

## 2015-02-16 MED ORDER — OXYTOCIN 10 UNIT/ML IJ SOLN
40.0000 [IU] | INTRAVENOUS | Status: DC | PRN
  Administered 2015-02-16: 40 [IU] via INTRAVENOUS

## 2015-02-16 MED ORDER — WITCH HAZEL-GLYCERIN EX PADS: 1.0000 "application " | MEDICATED_PAD | CUTANEOUS | Status: DC | PRN

## 2015-02-16 MED ORDER — OXYTOCIN 40 UNITS IN LACTATED RINGERS INFUSION - SIMPLE MED: 62.5000 mL/h | INTRAVENOUS | Status: AC

## 2015-02-16 MED ORDER — CEFAZOLIN SODIUM-DEXTROSE 2-3 GM-% IV SOLR
INTRAVENOUS | Status: AC
  Filled 2015-02-16: qty 50

## 2015-02-16 MED ORDER — SCOPOLAMINE 1 MG/3DAYS TD PT72
1.0000 | MEDICATED_PATCH | Freq: Once | TRANSDERMAL | Status: DC
  Filled 2015-02-16: qty 1

## 2015-02-16 MED ORDER — MEPERIDINE HCL 25 MG/ML IJ SOLN: 6.2500 mg | INTRAMUSCULAR | Status: DC | PRN

## 2015-02-16 SURGICAL SUPPLY — 29 items
CLAMP CORD UMBIL (MISCELLANEOUS) IMPLANT
CLOTH BEACON ORANGE TIMEOUT ST (SAFETY) ×3 IMPLANT
DRAPE SHEET LG 3/4 BI-LAMINATE (DRAPES) IMPLANT
DRSG OPSITE POSTOP 4X10 (GAUZE/BANDAGES/DRESSINGS) ×3 IMPLANT
DURAPREP 26ML APPLICATOR (WOUND CARE) ×3 IMPLANT
ELECT REM PT RETURN 9FT ADLT (ELECTROSURGICAL) ×3
ELECTRODE REM PT RTRN 9FT ADLT (ELECTROSURGICAL) ×1 IMPLANT
EXTRACTOR VACUUM M CUP 4 TUBE (SUCTIONS) IMPLANT
EXTRACTOR VACUUM M CUP 4' TUBE (SUCTIONS)
GLOVE BIO SURGEON STRL SZ7 (GLOVE) ×3 IMPLANT
GOWN STRL REUS W/TWL LRG LVL3 (GOWN DISPOSABLE) ×6 IMPLANT
KIT ABG SYR 3ML LUER SLIP (SYRINGE) IMPLANT
LIQUID BAND (GAUZE/BANDAGES/DRESSINGS) IMPLANT
NEEDLE HYPO 22GX1.5 SAFETY (NEEDLE) IMPLANT
NEEDLE HYPO 25X5/8 SAFETYGLIDE (NEEDLE) IMPLANT
NS IRRIG 1000ML POUR BTL (IV SOLUTION) ×3 IMPLANT
PACK C SECTION WH (CUSTOM PROCEDURE TRAY) ×3 IMPLANT
PAD OB MATERNITY 4.3X12.25 (PERSONAL CARE ITEMS) ×3 IMPLANT
PENCIL SMOKE EVAC W/HOLSTER (ELECTROSURGICAL) ×3 IMPLANT
RTRCTR C-SECT PINK 25CM LRG (MISCELLANEOUS) ×3 IMPLANT
SUT CHROMIC 1 CTX 36 (SUTURE) ×6 IMPLANT
SUT CHROMIC 2 0 CT 1 (SUTURE) ×3 IMPLANT
SUT PDS AB 0 CTX 60 (SUTURE) ×3 IMPLANT
SUT VIC AB 2-0 CT1 27 (SUTURE) ×2
SUT VIC AB 2-0 CT1 TAPERPNT 27 (SUTURE) ×1 IMPLANT
SUT VIC AB 4-0 KS 27 (SUTURE) IMPLANT
SYR 30ML LL (SYRINGE) IMPLANT
TOWEL OR 17X24 6PK STRL BLUE (TOWEL DISPOSABLE) ×3 IMPLANT
TRAY FOLEY CATH SILVER 14FR (SET/KITS/TRAYS/PACK) ×3 IMPLANT

## 2015-02-16 NOTE — Anesthesia Postprocedure Evaluation (Signed)
  Anesthesia Post-op Note  Patient: Amy Jackson  Procedure(s) Performed: Procedure(s): REPEAT CESAREAN SECTION (N/A)  Patient Location: PACU  Anesthesia Type:Spinal  Level of Consciousness: awake, alert  and oriented  Airway and Oxygen Therapy: Patient Spontanous Breathing  Post-op Pain: none  Post-op Assessment: Post-op Vital signs reviewed, Patient's Cardiovascular Status Stable, Respiratory Function Stable, Patent Airway, No signs of Nausea or vomiting, Pain level controlled, No headache, No backache, Spinal receding and Patient able to bend at knees LLE Motor Response: Purposeful movement LLE Sensation: Numbness RLE Motor Response: Purposeful movement RLE Sensation: Numbness      Post-op Vital Signs: Reviewed and stable  Last Vitals:  Filed Vitals:   02/16/15 1445  BP: 111/65  Pulse: 71  Temp:   Resp: 16    Complications: No apparent anesthesia complications

## 2015-02-16 NOTE — Lactation Note (Signed)
This note was copied from the chart of Amy Jackson. Lactation Consultation Note  Patient Name: Amy Jackson [id number] Date: 02/16/2015 Reason for consult: Follow-up assessment Mom reports baby has been sleepy and has not been waking to eat. Mom attempted BF but baby would not wake. LC helped with hand expression and collected in spoon, which was fed to baby. Mom attempted BF again in cross cradle and baby did latch after breast compression and did some sucking but no audible swallowing noted. Basic teaching reviewed with mom, booklet given and outpatient number given. Encouraged mom to call for assistance with BF, questions or concerns.  Maternal Data Has patient been taught Hand Expression?: Yes Does the patient have breastfeeding experience prior to this delivery?: Yes  Feeding Feeding Type: Breast Fed Length of feed: 5 min  LATCH Score/Interventions Latch: Repeated attempts needed to sustain latch, nipple held in mouth throughout feeding, stimulation needed to elicit sucking reflex. Intervention(s): Waking techniques;Skin to skin Intervention(s): Adjust position;Assist with latch;Breast massage;Breast compression  Audible Swallowing: None Intervention(s): Hand expression  Type of Nipple: Everted at rest and after stimulation  Comfort (Breast/Nipple): Soft / non-tender     Hold (Positioning): Assistance needed to correctly position infant at breast and maintain latch. Intervention(s): Breastfeeding basics reviewed;Support Pillows;Position options;Skin to skin  LATCH Score: 6  Lactation Tools Discussed/Used WIC Program: No   Consult Status Consult Status: Follow-up Date: 02/17/15 Follow-up type: In-patient    Oneal Grout 02/16/2015, 10:04 PM

## 2015-02-16 NOTE — Op Note (Signed)
Pre-Operative Diagnosis: 1) 39 week intrauterine pregnancy 2) history of prior cesarean section, desires repeat Postoperative Diagnosis: same Procedure: repeat low transverse cesarean section Surgeon: Dr. Waynard Reeds Assistant: Dr. Marlow Baars Operative Findings: vigorous female infant in the vertex presentation with Apgar scores of 9 at 1 minute and 9 and 5 minutes. Left Lateral extension of the uterine incision. Adhesive disease of the latter on the left lower uterine segment. Specimen: placenta for disposal EBL: Total I/O In: 2000 [I.V.:2000] Out: 850 [Urine:300; Blood:550]   Procedure:Ms. Jacque is an 29 year old gravida 3 para 0111 at 51 weeks and 0 days estimated gestational age who presents for cesarean section. Following the appropriate informed consent the patient was brought to the operating room where spinal anesthesia was administered and found to be adequate. She was placed in the dorsal supine position with a leftward tilt. She was prepped and draped in the normal sterile fashion. Scalpel was then used to make a Pfannenstiel skin incision which was carried down to the underlying layers of soft tissue to the fascia. The fascia was incised in the midline and the fascial incision was extended laterally with Mayo scissors. The superior aspect of the fascial incision was grasped with Coker clamps x2, tented up and the rectus muscles dissected off sharply with the electrocautery unit area and the same procedure was repeated on the inferior aspect of the fascial incision. The rectus muscles were separated in the midline. The abdominal peritoneum was identified, tented up, entered sharply, and the incision was extended superiorly and inferiorly with good visualization of the bladder. The Alexis retractor was then deployed. The vesicouterine peritoneum was identified, tented up, entered sharply, and the bladder flap was created digitally. The lower uterine incision was noted to be very thin.   Scalpel was then used to make a low transverse incision on the uterus which was extended laterally with both blunt dissection and the bandage scissors. The fetal vertex was identified, delivered easily through the uterine incision followed by the body. The infant was bulb suctioned on the operative field cried vigorously, cord was clamped and cut and the infant was passed to the waiting neonatologist. Placenta was then delivered spontaneously, the uterus was cleared of all clot and debris. The uterine incision was repaired with #1 chromic in running locked fashion. Once the uterine incision was closed anesthesia alerted the surgeon that bright red blood was noted in the patient's Foley catheter. The uterine incision was reinspected and the bladder was noted to be close to the repaired uterine incision on the left-hand aspect. The uterine incision was opened from the left-hand side and the bladder was further dissected off the lower uterine segment. The uterine incision was then reclosed in a running locked fashion. The bladder then was retrograde filled with sterile milk. No milk extravasated into the surgical field. Anesthesia noted that the urine running through the Foley was now clear. The Alexis retractor was removed. The uterus was returned to the abdominal cavity the abdominal cavity was cleared of all clot and debris. The abdominal peritoneum was reapproximated with 2-0 Vicryl in a running fashion, the rectus muscles was reapproximated with 2-0 chromic in a running fashion. The fascia was closed with a looped PDS in a running fashion. The subcutaneous tissue was closed with 20 plain gut suture and a interrupted fashion. The skin was closed with 4-0 vicryl in a subcuticular fashion and surgical skin glue. All sponge lap and needle counts were correct x2. Patient tolerated the procedure well and recovered  in stable condition following the procedure.

## 2015-02-16 NOTE — Consult Note (Signed)
[facility]  Delivery Note:  C-section       02/16/2015  12:16 PM  I was called to the operating room at the request of the patient's obstetrician (Dr. Ross) for a repeat c-section.  PRENATAL HX:  29 y/o G3P0111 at [redacted] weeks gestation.  She is rubella non-immune and has had a previous c-section, otherwise this pregnancy has been uncomplicated.   INTRAPARTUM HX:   Repeat c-section with AROM at delivery  DELIVERY:  Infant was vigorous at delivery, requiring no resuscitation other than standard warming, drying and stimulation.  APGARs 8 and 9.  Exam within normal limits.  After 5 minutes, baby left with nurse to assist parents with skin-to-skin care.   _____________________ Electronically Signed By: Lindsey Murphy, MD Neonatologist  

## 2015-02-16 NOTE — Anesthesia Procedure Notes (Signed)
Spinal Patient location during procedure: OR Start time: 02/16/2015 11:49 AM Staffing Anesthesiologist: Mal Amabile Performed by: anesthesiologist  Preanesthetic Checklist Completed: patient identified, site marked, surgical consent, pre-op evaluation, timeout performed, IV checked, risks and benefits discussed and monitors and equipment checked Spinal Block Patient position: sitting Prep: site prepped and draped and DuraPrep Patient monitoring: heart rate, cardiac monitor, continuous pulse ox and blood pressure Approach: midline Location: L3-4 Injection technique: single-shot Needle Needle type: Tuohy and Spinocan  Needle gauge: 25 G Needle length: 9 cm Needle insertion depth: 5 cm Assessment Sensory level: T4 Additional Notes SAB attempt x 1. Difficult due to poor position. 17 ga Touhy x 1 LOR with air to epidural space. Spinal through epidural needle. CSF clear, free flow, no heme or paresthesias. Local + narcotics injected and needles withdrawn. Patient tolerated procedure well. Adequate sensory level.

## 2015-02-16 NOTE — Progress Notes (Signed)
Foley with no output in PACU.  Attempted to flush, unable to flush.  Notified K. Ross, MD, foley removed and new 14 Fr foley inserted with no urine back.  Flushed NS irrigation, received 350 ml clear, yellow urine.

## 2015-02-16 NOTE — Anesthesia Preprocedure Evaluation (Addendum)
Anesthesia Evaluation  Patient identified by MRN, date of birth, ID band Patient awake    Reviewed: Allergy & Precautions, NPO status , Patient's Chart, lab work & pertinent test results  History of Anesthesia Complications (+) Family history of anesthesia reaction and history of anesthetic complications  Airway Mallampati: I  TM Distance: >3 FB Neck ROM: Full    Dental no notable dental hx. (+) Teeth Intact   Pulmonary neg pulmonary ROS,    Pulmonary exam normal breath sounds clear to auscultation       Cardiovascular negative cardio ROS Normal cardiovascular exam Rhythm:Regular Rate:Normal     Neuro/Psych PSYCHIATRIC DISORDERS Depression negative neurological ROS     GI/Hepatic Neg liver ROS, GERD  ,  Endo/Other  negative endocrine ROS  Renal/GU negative Renal ROS  negative genitourinary   Musculoskeletal negative musculoskeletal ROS (+)   Abdominal   Peds  Hematology  (+) anemia ,   Anesthesia Other Findings   Reproductive/Obstetrics (+) Pregnancy Previous C/Section                            Anesthesia Physical Anesthesia Plan  ASA: II  Anesthesia Plan: Spinal   Post-op Pain Management:    Induction:   Airway Management Planned: Natural Airway  Additional Equipment:   Intra-op Plan:   Post-operative Plan:   Informed Consent: I have reviewed the patients History and Physical, chart, labs and discussed the procedure including the risks, benefits and alternatives for the proposed anesthesia with the patient or authorized representative who has indicated his/her understanding and acceptance.     Plan Discussed with: Anesthesiologist, CRNA and Surgeon  Anesthesia Plan Comments:         Anesthesia Quick Evaluation

## 2015-02-16 NOTE — H&P (Signed)
Amy Jackson is a 29 y.o. female presenting for repeat cesarean  29 yo G3P0111 @ 39+0 presents for scheduled cesarean section. Overall her pregnancy has been uncomplicated except for rubella non-immune status. With her first cesarean she was not able to achieve surgical anesthesia with a spinal and she required general anesthesia.  History OB History    Gravida Para Term Preterm AB TAB SAB Ectopic Multiple Living   Past Medical History  Diagnosis Date  . Depression   . Complication of anesthesia     spinal anes- unsuccessful for 1st CS  . Family history of adverse reaction to anesthesia     mother - difficult intubation   Past Surgical History  Procedure Laterality Date  . Dilation and curettage of uterus    . Cesarean section      x1  . Mouth surgery     Family History: family history includes Cancer in her maternal grandmother; Diabetes in her maternal grandmother and mother; Heart disease in her paternal grandmother; Hyperlipidemia in her father; Hypertension in her father and mother. Social History:  reports that she has never smoked. She has never used smokeless tobacco. She reports that she does not drink alcohol or use illicit drugs.   Prenatal Transfer Tool  Maternal Diabetes: No Genetic Screening: Normal Maternal Ultrasounds/Referrals: Normal Fetal Ultrasounds or other Referrals:  None Maternal Substance Abuse:  No Significant Maternal Medications:  None Significant Maternal Lab Results:  None Other Comments:  None  ROS: as above    Blood pressure 115/86, pulse 98, temperature 98.1 F (36.7 C), temperature source Oral, resp. rate 16, last menstrual period 05/19/2014, SpO2 100 %. Exam Physical Exam  Prenatal labs: ABO, Rh: --/--/A POS, A POS (09/28 1130) Antibody: NEG (09/28 1130) Rubella: Nonimmune (03/21 0000) RPR: Non Reactive (09/28 1130)  HBsAg:   NR HIV: Non-reactive (03/21 0000)  GBS:   negative  Assessment/Plan: 1) Admit 2)  Cesarean section. R/B/A reviewed and the patient wants to proceed 3) SCDs  To OR 4) ANCEF 2gm   ROSS,KENDRA H. 02/16/2015, 10:38 AM

## 2015-02-16 NOTE — Transfer of Care (Signed)
Immediate Anesthesia Transfer of Care Note  Patient: Amy Jackson  Procedure(s) Performed: Procedure(s): REPEAT CESAREAN SECTION (N/A)  Patient Location: PACU  Anesthesia Type:Spinal  Level of Consciousness: awake, alert , oriented and patient cooperative  Airway & Oxygen Therapy: Patient Spontanous Breathing  Post-op Assessment: Report given to RN and Post -op Vital signs reviewed and stable  Post vital signs: Reviewed and stable  Last Vitals:  Filed Vitals:   02/16/15 1033  BP: 115/86  Pulse: 98  Temp: 36.7 C  Resp: 16    Complications: No apparent anesthesia complications

## 2015-02-17 LAB — CBC
HEMATOCRIT: 30.1 % — AB (ref 36.0–46.0)
HEMOGLOBIN: 9.8 g/dL — AB (ref 12.0–15.0)
MCH: 27.1 pg (ref 26.0–34.0)
MCHC: 32.6 g/dL (ref 30.0–36.0)
MCV: 83.4 fL (ref 78.0–100.0)
Platelets: 121 10*3/uL — ABNORMAL LOW (ref 150–400)
RBC: 3.61 MIL/uL — ABNORMAL LOW (ref 3.87–5.11)
RDW: 14.5 % (ref 11.5–15.5)
WBC: 10 10*3/uL (ref 4.0–10.5)

## 2015-02-17 LAB — BIRTH TISSUE RECOVERY COLLECTION (PLACENTA DONATION)

## 2015-02-17 NOTE — Lactation Note (Signed)
This note was copied from the chart of Amy Jackson. Lactation Consultation Note: Mother request assistance with infants feeding. Infant is 51 hours old and has had several attempts to latch with only one feeding. Mother states that she did sustain latch for 30 mins the am but was on and off. Mother is concerned due to attempt to breastfeed her first child without success. Mother request that I bring her the Medela electric pump that Insurance provides. Mother was given the Medela tote PIS.  Assist mother with latching infant in football hold. Mother reluctant to use firm support and support her breast and infants head/ Advised the need to use good support with pillows to hold infant close. Infant latched on with a few suckles. Several attempt to rouse infant for rhythmic suckling. Infant very sleepy. Observed a few swallows with breast compression. Mother was also taken a DEBP in room . Staff nurse to sat up with instructions . Mother advised to hand express in a spoon if infant was unable to rouse well for feeding. Advised mother to supplement any milk that she pumps back to infant using a spoon. Suggested that mother post pump for 15-20 mins after each feeding do good breast massage. Mother advised to page [facility] for next feeding or when infant latches well. Discussed cluster feeding.   Patient Name: Amy Jackson [id number] Date: 02/17/2015 Reason for consult: Follow-up assessment   Maternal Data    Feeding Feeding Type: Breast Fed Length of feed: 30 min  LATCH Score/Interventions Latch: Grasps breast easily, tongue down, lips flanged, rhythmical sucking.  Audible Swallowing: A few with stimulation  Type of Nipple: Everted at rest and after stimulation  Comfort (Breast/Nipple): Soft / non-tender     Hold (Positioning): Assistance needed to correctly position infant at breast and maintain latch.  LATCH Score: 8  Lactation Tools Discussed/Used     Consult Status Consult  Status: Follow-up Date: 02/17/15 Follow-up type: In-patient    Stevan Born [facility] 02/17/2015, 1:22 PM

## 2015-02-17 NOTE — Addendum Note (Signed)
Addendum  created 02/17/15 1156 by Algis Greenhouse, CRNA   Modules edited: Notes Section   Notes Section:  File: [id number]

## 2015-02-17 NOTE — Lactation Note (Signed)
This note was copied from the chart of Amy Philamena Kramar. 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   Patient Name: Amy Jackson [id number] Date: 02/17/2015 Reason for consult: Follow-up assessment   Maternal Data    Feeding Feeding Type: Breast Fed Length of feed: 30 min  LATCH Score/Interventions Latch: Grasps breast easily, tongue down, lips flanged, rhythmical sucking.  Audible Swallowing: A few with stimulation  Type of Nipple: Everted at rest and after stimulation  Comfort (Breast/Nipple): Soft / non-tender     Hold (Positioning): Assistance needed to correctly position infant at breast and maintain latch.  LATCH Score: 8  Lactation Tools Discussed/Used     Consult Status Consult Status: Follow-up Date: 02/17/15 Follow-up type: In-patient    Stevan Born [facility] 02/17/2015, 1:21 PM

## 2015-02-17 NOTE — Anesthesia Postprocedure Evaluation (Signed)
Anesthesia Post Note  Patient: Amy Jackson  Procedure(s) Performed: Procedure(s) (LRB): REPEAT CESAREAN SECTION (N/A)  Anesthesia type: Spinal  Patient location: Mother/Baby  Post pain: Pain level controlled  Post assessment: Post-op Vital signs reviewed  Last Vitals:  Filed Vitals:   02/17/15 0840  BP: 114/57  Pulse: 92  Temp: 36.3 C  Resp: 18    Post vital signs: Reviewed  Level of consciousness: awake  Complications: No apparent anesthesia complications

## 2015-02-17 NOTE — Progress Notes (Signed)
Acknowledged order for social work consult for history of depression. Per chart review, patient was being prescribed medication in the past.  Spoke with RN caring for patient and informed that MOB is bonding/interacting well with the infant. RN reported that MOB did not appear anxious/overwhelmed at this time.  She will contact the Clinical Social Worker if needs arise or upon MOB request.     

## 2015-02-17 NOTE — Progress Notes (Signed)
Subjective: Postpartum Day 1: Cesarean Delivery Patient reports adequate pain control, voiding without difficulty   Objective: Vital signs in last 24 hours: Temp:  [97.9 F (36.6 C)-98.3 F (36.8 C)] 98.1 F (36.7 C) (10/01 0404) Pulse Rate:  [60-98] 72 (10/01 0404) Resp:  [13-20] 18 (10/01 0404) BP: (102-120)/(45-86) 120/68 mmHg (10/01 0404) SpO2:  [95 %-100 %] 97 % (09/30 2318)  Physical Exam:  General: alert, cooperative and appears stated age Lochia: appropriate Uterine Fundus: firm Incision: healing well DVT Evaluation: No evidence of DVT seen on physical exam.   Recent Labs  02/14/15 1130 02/17/15 0608  HGB 11.5* 9.8*  HCT 34.3* 30.1*    Assessment/Plan: Status post Cesarean section. Doing well postoperatively.  Continue current care.  Leeroy Lovings H. 02/17/2015, 8:43 AM

## 2015-02-18 MED ORDER — IBUPROFEN 600 MG PO TABS: 600.0000 mg | ORAL_TABLET | Freq: Four times a day (QID) | ORAL | Status: DC | PRN

## 2015-02-18 MED ORDER — OXYCODONE-ACETAMINOPHEN 5-325 MG PO TABS: 1.0000 | ORAL_TABLET | ORAL | Status: DC | PRN

## 2015-02-18 MED ORDER — MEASLES, MUMPS & RUBELLA VAC ~~LOC~~ INJ
0.5000 mL | INJECTION | Freq: Once | SUBCUTANEOUS | Status: AC
  Administered 2015-02-18: 0.5 mL via SUBCUTANEOUS
  Filled 2015-02-18: qty 0.5

## 2015-02-18 NOTE — Discharge Summary (Signed)
Obstetric Discharge Summary Reason for Admission: cesarean section Prenatal Procedures: ultrasound Intrapartum Procedures: cesarean: low cervical, transverse Postpartum Procedures: Rubella Ig Complications-Operative and Postpartum: none HEMOGLOBIN  Date Value Ref Range Status  02/17/2015 9.8* 12.0 - 15.0 g/dL Final   HCT  Date Value Ref Range Status  02/17/2015 30.1* 36.0 - 46.0 % Final    Physical Exam:  General: alert, cooperative and appears stated age 17: appropriate Uterine Fundus: firm Incision: healing well DVT Evaluation: No evidence of DVT seen on physical exam.  Discharge Diagnoses: Term Pregnancy-delivered  Discharge Information: Date: 02/18/2015 Activity: pelvic rest Diet: routine Medications: Ibuprofen, Colace and Percocet Condition: improved Instructions: refer to practice specific booklet Discharge to: home Follow-up Information    Follow up with Almon Hercules., MD In 4 weeks.   Specialty:  Obstetrics and Gynecology   Why:  For a postpartum evaluation   Contact information:   [address] [place] [address] [phone]       Newborn Data: Live born female  Birth Weight: 7 lb 5.3 oz (3325 g) APGAR: 8, 9  Home with mother.  Arielis Leonhart H. 02/18/2015, 9:59 AM

## 2015-02-19 ENCOUNTER — Encounter (HOSPITAL_COMMUNITY): Payer: Self-pay | Admitting: Obstetrics and Gynecology

## 2015-04-19 DIAGNOSIS — N159 Renal tubulo-interstitial disease, unspecified: Secondary | ICD-10-CM

## 2015-04-19 HISTORY — DX: Renal tubulo-interstitial disease, unspecified: N15.9

## 2015-04-22 ENCOUNTER — Encounter (HOSPITAL_COMMUNITY): Payer: Self-pay | Admitting: Emergency Medicine

## 2015-04-22 ENCOUNTER — Emergency Department (HOSPITAL_COMMUNITY)
Admission: EM | Admit: 2015-04-22 | Discharge: 2015-04-22 | Disposition: A | Discharge status: Home / Self Care | Payer: 59 | Source: Home / Self Care | Attending: Emergency Medicine | Admitting: Emergency Medicine

## 2015-04-22 DIAGNOSIS — B349 Viral infection, unspecified: Secondary | ICD-10-CM

## 2015-04-22 DIAGNOSIS — R509 Fever, unspecified: Secondary | ICD-10-CM

## 2015-04-22 DIAGNOSIS — J111 Influenza due to unidentified influenza virus with other respiratory manifestations: Secondary | ICD-10-CM | POA: Diagnosis not present

## 2015-04-22 LAB — POCT URINALYSIS DIP (DEVICE)
Glucose, UA: NEGATIVE mg/dL
KETONES UR: 80 mg/dL — AB
Nitrite: POSITIVE — AB
PH: 6.5 (ref 5.0–8.0)
Protein, ur: >=300 mg/dL — AB
SPECIFIC GRAVITY, URINE: 1.025 (ref 1.005–1.030)
Urobilinogen, UA: 2 mg/dL — ABNORMAL HIGH (ref 0.0–1.0)

## 2015-04-22 LAB — POCT RAPID STREP A: STREPTOCOCCUS, GROUP A SCREEN (DIRECT): NEGATIVE

## 2015-04-22 NOTE — ED Provider Notes (Signed)
CSN: [id number]     Arrival date & time 04/22/15  1500 History   First MD Initiated Contact with Patient 04/22/15 1544     Chief Complaint  Patient presents with  . Headache  . Fever   (Consider location/radiation/quality/duration/timing/severity/associated sxs/prior Treatment) HPI Comments: [name] is a very pleasant 29 yo who presents with 3 days of headache, fever, myalgia and more recently mild right lower quadrant "soreness". Night time subjective fevers and chills. Noted body aches. Also reports stiffness in her neck for the past 3 days.  She is 8 weeks s/p C-section with healthy delivery. Recent check on incision was good. Mild diarrhea today. No nausea or emesis. No cough or SOB. No recent travel and no known exposures. See remainder of ROS.   The history is provided by the patient.    Past Medical History  Diagnosis Date  . Depression   . Complication of anesthesia     spinal anes- unsuccessful for 1st CS  . Family history of adverse reaction to anesthesia     mother - difficult intubation   Past Surgical History  Procedure Laterality Date  . Dilation and curettage of uterus    . Cesarean section      x1  . Mouth surgery    . Cesarean section N/A 02/16/2015    Procedure: REPEAT CESAREAN SECTION;  Surgeon: Waynard ReedsKendra Ross, MD;  Location: [facility];  Service: Obstetrics;  Laterality: N/A;   Family History  Problem Relation Age of Onset  . Diabetes Mother   . Hypertension Mother   . Hyperlipidemia Father   . Hypertension Father   . Cancer Maternal Grandmother   . Diabetes Maternal Grandmother   . Heart disease Paternal Grandmother     MI   Social History  Substance Use Topics  . Smoking status: Never Smoker   . Smokeless tobacco: Never Used  . Alcohol Use: No   OB History    Gravida Para Term Preterm AB TAB SAB Ectopic Multiple Living   3 2 1 1      0 1     Review of Systems  Constitutional: Positive for fever, chills and fatigue.  Eyes: Negative.    Respiratory: Negative for cough and wheezing.   Gastrointestinal: Positive for abdominal pain, diarrhea and rectal pain. Negative for nausea, vomiting, constipation, blood in stool and abdominal distention.  Genitourinary: Positive for vaginal bleeding. Negative for dysuria, decreased urine volume and pelvic pain.       Just began her menses post partum  Musculoskeletal: Positive for myalgias and arthralgias.  Skin: Negative.   Neurological: Positive for headaches.  Psychiatric/Behavioral: Negative.     Allergies  Dilaudid  Home Medications   Prior to Admission medications   Medication Sig Start Date End Date Taking? Authorizing Provider  FLUoxetine (PROZAC) 20 MG tablet Take 20 mg by mouth daily.   Yes Historical Provider, MD  levonorgestrel-ethinyl estradiol (PORTIA-28) 0.15-30 MG-MCG tablet Take 1 tablet by mouth daily.   Yes Historical Provider, MD  ibuprofen (ADVIL,MOTRIN) 600 MG tablet Take 1 tablet (600 mg total) by mouth every 6 (six) hours as needed. 02/18/15   Waynard ReedsKendra Ross, MD  oxyCODONE-acetaminophen (ROXICET) 5-325 MG tablet Take 1-2 tablets by mouth every 4 (four) hours as needed for severe pain. 02/18/15   Waynard ReedsKendra Ross, MD  Prenatal Vit-Fe Fumarate-FA (PRENATAL MULTIVITAMIN) TABS tablet Take 1 tablet by mouth daily at 12 noon.    Historical Provider, MD   Meds Ordered and Administered this Visit  Medications -  No data to display  BP 140/85 mmHg  Pulse 126  Temp(Src) 99.8 F (37.7 C) (Oral)  SpO2 100%  LMP 04/20/2015 No data found.   Physical Exam  Constitutional: She is oriented to person, place, and time. She appears well-developed and well-nourished. No distress.  HENT:  Head: Normocephalic and atraumatic.  Right Ear: External ear normal.  Left Ear: External ear normal.  Mouth/Throat: No oropharyngeal exudate.  Mild erythema and enlarged tonsils. No exudate  Neck: Normal range of motion. Neck supple.  Cardiovascular: Normal rate and regular rhythm.    Pulmonary/Chest: Effort normal and breath sounds normal. No respiratory distress.  Abdominal: Soft. She exhibits no distension. There is tenderness. There is no rebound and no guarding.  Mild RLQ tenderness to palpation. Without rebound or garding  Lymphadenopathy:    She has no cervical adenopathy.  Neurological: She is alert and oriented to person, place, and time.  Skin: Skin is warm. No rash noted. She is diaphoretic.  Psychiatric: She has a normal mood and affect. Her behavior is normal.  Nursing note and vitals reviewed.   ED Course  Procedures (including critical care time)  Labs Review Labs Reviewed  POCT URINALYSIS DIP (DEVICE) - Abnormal; Notable for the following:    Bilirubin Urine SMALL (*)    Ketones, ur 80 (*)    Hgb urine dipstick MODERATE (*)    Protein, ur >=300 (*)    Urobilinogen, UA 2.0 (*)    Nitrite POSITIVE (*)    Leukocytes, UA SMALL (*)    All other components within normal limits  POCT RAPID STREP A    Imaging Review No results found.   Visual Acuity Review  Right Eye Distance:   Left Eye Distance:   Bilateral Distance:    Right Eye Near:   Left Eye Near:    Bilateral Near:         MDM   1. Viral illness   2. Other specified fever   3. Influenza     No evidence or etiology of a bacterial infection. She is non-toxic. Most likely viral etiology or flu like. (urine results in setting of menses-without dysuria).  At this time treat symptomatically with Motrin, rest, fluids. Encouraged to f/u if symptoms worsen or do not improved.    Riki Sheer, PA-C 04/22/15 [phone]

## 2015-04-22 NOTE — ED Notes (Signed)
Multiple concerns:  Fever, chills, headache, right chest soreness, stiff neck, diarrhea, and right side abdominal pain.  Patient very anxious, talkative

## 2015-04-22 NOTE — Discharge Instructions (Signed)
Influenza, Adult Influenza ("the flu") is a viral infection of the respiratory tract. It occurs more often in winter months because people spend more time in close contact with one another. Influenza can make you feel very sick. Influenza easily spreads from person to person (contagious). CAUSES  Influenza is caused by a virus that infects the respiratory tract. You can catch the virus by breathing in droplets from an infected person's cough or sneeze. You can also catch the virus by touching something that was recently contaminated with the virus and then touching your mouth, nose, or eyes. RISKS AND COMPLICATIONS You may be at risk for a more severe case of influenza if you smoke cigarettes, have diabetes, have chronic heart disease (such as heart failure) or lung disease (such as asthma), or if you have a weakened immune system. Elderly people and pregnant women are also at risk for more serious infections. The most common problem of influenza is a lung infection (pneumonia). Sometimes, this problem can require emergency medical care and may be life threatening. SIGNS AND SYMPTOMS  Symptoms typically last 4 to 10 days and may include:  Fever.  Chills.  Headache, body aches, and muscle aches.  Sore throat.  Chest discomfort and cough.  Poor appetite.  Weakness or feeling tired.  Dizziness.  Nausea or vomiting. DIAGNOSIS  Diagnosis of influenza is often made based on your history and a physical exam. A nose or throat swab test can be done to confirm the diagnosis. TREATMENT  In mild cases, influenza goes away on its own. Treatment is directed at relieving symptoms. For more severe cases, your health care provider may prescribe antiviral medicines to shorten the sickness. Antibiotic medicines are not effective because the infection is caused by a virus, not by bacteria. HOME CARE INSTRUCTIONS  Take medicines only as directed by your health care provider.  Use a cool mist humidifier  to make breathing easier.  Get plenty of rest until your temperature returns to normal. This usually takes 3 to 4 days.  Drink enough fluid to keep your urine clear or pale yellow.  Cover yourmouth and nosewhen coughing or sneezing,and wash your handswellto prevent thevirusfrom spreading.  Stay homefromwork orschool untilthe fever is gonefor at least 851full day. PREVENTION  An annual influenza vaccination (flu shot) is the best way to avoid getting influenza. An annual flu shot is now routinely recommended for all adults in the [place] SEEK MEDICAL CARE IF:  You experiencechest pain, yourcough worsens,or you producemore mucus.  Youhave nausea,vomiting, ordiarrhea.  Your fever returns or gets worse. SEEK IMMEDIATE MEDICAL CARE IF:  You havetrouble breathing, you become short of breath,or your skin ornails becomebluish.  You have severe painor stiffnessin the neck.  You develop a sudden headache, or pain in the face or ear.  You have nausea or vomiting that you cannot control. MAKE SURE YOU:   Understand these instructions.  Will watch your condition.  Will get help right away if you are not doing well or get worse.   This information is not intended to replace advice given to you by your health care provider. Make sure you discuss any questions you have with your health care provider.   You have a viral illness perhaps FLU. Treat symptomatically with fluids, rest, Motrin. Your strep was negative, but will culture and call if anything changes. The urine results are consistent with you being on your menstrual period. If worse go to the ER, if does not improve f/u with PCP.  Document Released: 05/02/2000 Document Revised: 05/26/2014 Document Reviewed: 08/04/2011 Elsevier Interactive Patient Education [employer].

## 2015-04-24 ENCOUNTER — Emergency Department (HOSPITAL_BASED_OUTPATIENT_CLINIC_OR_DEPARTMENT_OTHER): Payer: 59

## 2015-04-24 ENCOUNTER — Inpatient Hospital Stay (HOSPITAL_BASED_OUTPATIENT_CLINIC_OR_DEPARTMENT_OTHER)
Admission: EM | Admit: 2015-04-24 | Discharge: 2015-04-27 | DRG: 872 | Disposition: A | Discharge status: Home / Self Care | Payer: 59 | Attending: Internal Medicine | Admitting: Internal Medicine

## 2015-04-24 ENCOUNTER — Encounter (HOSPITAL_BASED_OUTPATIENT_CLINIC_OR_DEPARTMENT_OTHER): Payer: Self-pay | Admitting: *Deleted

## 2015-04-24 DIAGNOSIS — R6520 Severe sepsis without septic shock: Secondary | ICD-10-CM | POA: Diagnosis present

## 2015-04-24 DIAGNOSIS — Z79899 Other long term (current) drug therapy: Secondary | ICD-10-CM

## 2015-04-24 DIAGNOSIS — A419 Sepsis, unspecified organism: Secondary | ICD-10-CM

## 2015-04-24 DIAGNOSIS — R509 Fever, unspecified: Secondary | ICD-10-CM | POA: Diagnosis not present

## 2015-04-24 DIAGNOSIS — E876 Hypokalemia: Secondary | ICD-10-CM | POA: Diagnosis present

## 2015-04-24 DIAGNOSIS — F329 Major depressive disorder, single episode, unspecified: Secondary | ICD-10-CM | POA: Diagnosis present

## 2015-04-24 DIAGNOSIS — B9689 Other specified bacterial agents as the cause of diseases classified elsewhere: Secondary | ICD-10-CM | POA: Diagnosis present

## 2015-04-24 DIAGNOSIS — R5081 Fever presenting with conditions classified elsewhere: Secondary | ICD-10-CM | POA: Diagnosis present

## 2015-04-24 DIAGNOSIS — R000 Tachycardia, unspecified: Secondary | ICD-10-CM | POA: Diagnosis present

## 2015-04-24 DIAGNOSIS — R739 Hyperglycemia, unspecified: Secondary | ICD-10-CM | POA: Diagnosis present

## 2015-04-24 DIAGNOSIS — N12 Tubulo-interstitial nephritis, not specified as acute or chronic: Secondary | ICD-10-CM | POA: Diagnosis present

## 2015-04-24 DIAGNOSIS — A4151 Sepsis due to Escherichia coli [E. coli]: Principal | ICD-10-CM | POA: Diagnosis present

## 2015-04-24 DIAGNOSIS — R112 Nausea with vomiting, unspecified: Secondary | ICD-10-CM | POA: Diagnosis present

## 2015-04-24 DIAGNOSIS — R197 Diarrhea, unspecified: Secondary | ICD-10-CM

## 2015-04-24 HISTORY — DX: Renal tubulo-interstitial disease, unspecified: N15.9

## 2015-04-24 LAB — CBC WITH DIFFERENTIAL/PLATELET
BASOS PCT: 0 %
Basophils Absolute: 0 10*3/uL (ref 0.0–0.1)
EOS ABS: 0 10*3/uL (ref 0.0–0.7)
EOS PCT: 0 %
HCT: 36.9 % (ref 36.0–46.0)
Hemoglobin: 12.5 g/dL (ref 12.0–15.0)
Lymphocytes Relative: 9 %
Lymphs Abs: 0.7 10*3/uL (ref 0.7–4.0)
MCH: 27.8 pg (ref 26.0–34.0)
MCHC: 33.9 g/dL (ref 30.0–36.0)
MCV: 82 fL (ref 78.0–100.0)
MONO ABS: 0.6 10*3/uL (ref 0.1–1.0)
MONOS PCT: 8 %
NEUTROS PCT: 83 %
Neutro Abs: 6.6 10*3/uL (ref 1.7–7.7)
PLATELETS: 196 10*3/uL (ref 150–400)
RBC: 4.5 MIL/uL (ref 3.87–5.11)
RDW: 16.4 % — ABNORMAL HIGH (ref 11.5–15.5)
WBC: 7.9 10*3/uL (ref 4.0–10.5)

## 2015-04-24 LAB — BASIC METABOLIC PANEL
ANION GAP: 12 (ref 5–15)
Anion gap: 9 (ref 5–15)
BUN: 6 mg/dL (ref 6–20)
CHLORIDE: 103 mmol/L (ref 101–111)
CHLORIDE: 113 mmol/L — AB (ref 101–111)
CO2: 22 mmol/L (ref 22–32)
CO2: 19 mmol/L — ABNORMAL LOW (ref 22–32)
CREATININE: 0.59 mg/dL (ref 0.44–1.00)
Calcium: 8.7 mg/dL — ABNORMAL LOW (ref 8.9–10.3)
Calcium: 7.9 mg/dL — ABNORMAL LOW (ref 8.9–10.3)
Creatinine, Ser: 0.67 mg/dL (ref 0.44–1.00)
GFR calc Af Amer: >60 mL/min (ref >60)
GFR calc non Af Amer: >60 mL/min (ref >60)
GLUCOSE: 151 mg/dL — AB (ref 65–99)
Glucose, Bld: 163 mg/dL — ABNORMAL HIGH (ref 65–99)
POTASSIUM: 2.7 mmol/L — AB (ref 3.5–5.1)
POTASSIUM: 3.6 mmol/L (ref 3.5–5.1)
SODIUM: 137 mmol/L (ref 135–145)
Sodium: 141 mmol/L (ref 135–145)

## 2015-04-24 LAB — URINALYSIS, ROUTINE W REFLEX MICROSCOPIC
Bilirubin Urine: NEGATIVE
Glucose, UA: 250 mg/dL — AB
KETONES UR: NEGATIVE mg/dL
NITRITE: POSITIVE — AB
PROTEIN: 100 mg/dL — AB
Specific Gravity, Urine: 1.017 (ref 1.005–1.030)
pH: 6 (ref 5.0–8.0)

## 2015-04-24 LAB — URINE MICROSCOPIC-ADD ON

## 2015-04-24 LAB — PREGNANCY, URINE: PREG TEST UR: NEGATIVE

## 2015-04-24 LAB — I-STAT CG4 LACTIC ACID, ED
LACTIC ACID, VENOUS: 3.28 mmol/L — AB (ref 0.5–2.0)
LACTIC ACID, VENOUS: 0.41 mmol/L — AB (ref 0.5–2.0)

## 2015-04-24 LAB — CULTURE, GROUP A STREP: STREP A CULTURE: NEGATIVE

## 2015-04-24 LAB — INFLUENZA PANEL BY PCR (TYPE A & B)
H1N1FLUPCR: NOT DETECTED
INFLAPCR: NEGATIVE
INFLBPCR: NEGATIVE

## 2015-04-24 MED ORDER — MORPHINE SULFATE (PF) 2 MG/ML IV SOLN
1.0000 mg | INTRAVENOUS | Status: DC | PRN
  Administered 2015-04-25 – 2015-04-26 (×3): 1 mg via INTRAVENOUS
  Filled 2015-04-24 (×3): qty 1

## 2015-04-24 MED ORDER — ONDANSETRON HCL 4 MG/2ML IJ SOLN
4.0000 mg | Freq: Once | INTRAMUSCULAR | Status: AC
  Administered 2015-04-24: 4 mg via INTRAVENOUS
  Filled 2015-04-24: qty 2

## 2015-04-24 MED ORDER — IOHEXOL 300 MG/ML  SOLN
25.0000 mL | Freq: Once | INTRAMUSCULAR | Status: AC | PRN
Start: 2015-04-24 — End: 2015-04-24
  Administered 2015-04-24: 25 mL via ORAL

## 2015-04-24 MED ORDER — DEXTROSE 5 % IV SOLN
1.0000 g | Freq: Once | INTRAVENOUS | Status: AC
  Administered 2015-04-24: 1 g via INTRAVENOUS
  Filled 2015-04-24: qty 10

## 2015-04-24 MED ORDER — CEFTRIAXONE SODIUM 1 G IJ SOLR
INTRAMUSCULAR | Status: AC
  Filled 2015-04-24: qty 10

## 2015-04-24 MED ORDER — POTASSIUM CHLORIDE CRYS ER 20 MEQ PO TBCR
80.0000 meq | EXTENDED_RELEASE_TABLET | Freq: Once | ORAL | Status: AC
  Administered 2015-04-24: 80 meq via ORAL
  Filled 2015-04-24: qty 4

## 2015-04-24 MED ORDER — SODIUM CHLORIDE 0.9 % IJ SOLN
3.0000 mL | Freq: Two times a day (BID) | INTRAMUSCULAR | Status: DC
  Administered 2015-04-25 – 2015-04-27 (×4): 3 mL via INTRAVENOUS

## 2015-04-24 MED ORDER — DEXTROSE 5 % IV SOLN: 2.0000 g | Freq: Once | INTRAVENOUS | Status: DC

## 2015-04-24 MED ORDER — FLUOXETINE HCL 20 MG PO CAPS
20.0000 mg | ORAL_CAPSULE | Freq: Every day | ORAL | Status: DC
  Administered 2015-04-25 – 2015-04-27 (×3): 20 mg via ORAL
  Filled 2015-04-24 (×4): qty 1

## 2015-04-24 MED ORDER — ACETAMINOPHEN 500 MG PO TABS
1000.0000 mg | ORAL_TABLET | Freq: Once | ORAL | Status: AC
Start: 2015-04-24 — End: 2015-04-24
  Administered 2015-04-24: 1000 mg via ORAL
  Filled 2015-04-24: qty 2

## 2015-04-24 MED ORDER — IBUPROFEN 800 MG PO TABS
800.0000 mg | ORAL_TABLET | Freq: Once | ORAL | Status: AC
  Administered 2015-04-24: 800 mg via ORAL
  Filled 2015-04-24: qty 1

## 2015-04-24 MED ORDER — DEXTROSE 5 % IV SOLN
2.0000 g | INTRAVENOUS | Status: DC
  Administered 2015-04-25 – 2015-04-26 (×2): 2 g via INTRAVENOUS
  Filled 2015-04-24 (×3): qty 2

## 2015-04-24 MED ORDER — ACETAMINOPHEN 650 MG RE SUPP: 650.0000 mg | Freq: Four times a day (QID) | RECTAL | Status: DC | PRN

## 2015-04-24 MED ORDER — CEFTRIAXONE SODIUM 1 G IJ SOLR
1.0000 g | Freq: Once | INTRAMUSCULAR | Status: AC
  Administered 2015-04-24: 1 g via INTRAVENOUS

## 2015-04-24 MED ORDER — POTASSIUM CHLORIDE 10 MEQ/100ML IV SOLN
10.0000 meq | INTRAVENOUS | Status: AC
  Administered 2015-04-24 (×4): 10 meq via INTRAVENOUS
  Filled 2015-04-24 (×4): qty 100

## 2015-04-24 MED ORDER — DEXTROSE 5 % IV SOLN: 1.0000 g | INTRAVENOUS | Status: DC

## 2015-04-24 MED ORDER — ACETAMINOPHEN 325 MG PO TABS
650.0000 mg | ORAL_TABLET | Freq: Once | ORAL | Status: AC
  Administered 2015-04-24: 650 mg via ORAL
  Filled 2015-04-24: qty 2

## 2015-04-24 MED ORDER — MORPHINE SULFATE (PF) 4 MG/ML IV SOLN
2.0000 mg | Freq: Once | INTRAVENOUS | Status: AC
  Administered 2015-04-24: 2 mg via INTRAVENOUS
  Filled 2015-04-24: qty 1

## 2015-04-24 MED ORDER — LEVONORGESTREL-ETHINYL ESTRAD 0.15-30 MG-MCG PO TABS: 1.0000 | ORAL_TABLET | Freq: Every day | ORAL | Status: DC

## 2015-04-24 MED ORDER — SODIUM CHLORIDE 0.9 % IV BOLUS (SEPSIS)
1000.0000 mL | INTRAVENOUS | Status: AC
  Administered 2015-04-24 (×2): 1000 mL via INTRAVENOUS

## 2015-04-24 MED ORDER — ENOXAPARIN SODIUM 30 MG/0.3ML ~~LOC~~ SOLN
30.0000 mg | SUBCUTANEOUS | Status: DC
  Administered 2015-04-24: 30 mg via SUBCUTANEOUS
  Filled 2015-04-24: qty 0.3

## 2015-04-24 MED ORDER — HYDROCODONE-ACETAMINOPHEN 5-325 MG PO TABS
1.0000 | ORAL_TABLET | ORAL | Status: DC | PRN
  Administered 2015-04-25: 1 via ORAL
  Filled 2015-04-24: qty 1

## 2015-04-24 MED ORDER — ONDANSETRON HCL 4 MG/2ML IJ SOLN
4.0000 mg | Freq: Three times a day (TID) | INTRAMUSCULAR | Status: AC | PRN
  Administered 2015-04-25: 4 mg via INTRAVENOUS
  Filled 2015-04-24: qty 2

## 2015-04-24 MED ORDER — SODIUM CHLORIDE 0.9 % IV SOLN
INTRAVENOUS | Status: DC
  Administered 2015-04-24 – 2015-04-26 (×3): via INTRAVENOUS

## 2015-04-24 MED ORDER — IOHEXOL 300 MG/ML  SOLN
100.0000 mL | Freq: Once | INTRAMUSCULAR | Status: AC | PRN
  Administered 2015-04-24: 100 mL via INTRAVENOUS

## 2015-04-24 MED ORDER — ACETAMINOPHEN 325 MG PO TABS
650.0000 mg | ORAL_TABLET | Freq: Four times a day (QID) | ORAL | Status: DC | PRN
  Administered 2015-04-25 – 2015-04-27 (×6): 650 mg via ORAL
  Filled 2015-04-24 (×6): qty 2

## 2015-04-24 MED ORDER — OXYCODONE HCL 5 MG PO TABS
10.0000 mg | ORAL_TABLET | Freq: Once | ORAL | Status: AC
  Administered 2015-04-24: 10 mg via ORAL
  Filled 2015-04-24: qty 2

## 2015-04-24 MED ORDER — SODIUM CHLORIDE 0.9 % IV BOLUS (SEPSIS)
500.0000 mL | INTRAVENOUS | Status: AC
  Administered 2015-04-24: 500 mL via INTRAVENOUS

## 2015-04-24 NOTE — ED Notes (Signed)
Final report of strep testing negative  

## 2015-04-24 NOTE — ED Notes (Signed)
Care turned over to [facility] transport. 

## 2015-04-24 NOTE — ED Notes (Signed)
Lactic Acid of 3.28 report to Dr. Judd LieneLo.

## 2015-04-24 NOTE — ED Notes (Signed)
Fever x 6 days. Abdominal pain, diarrhea x 2 days. She had a c section 2 months ago and had a stitch in her bladder. She does not think the fever is related to the c section. [facility] but [facility]ab was never done.

## 2015-04-24 NOTE — H&P (Signed)
[facility] History and Physical  Amy Jackson ZOX:[id number] DOB: 1985/10/30 DOA: 04/24/2015  [facility] PCP: Amy Jackson, DONALD, MD   Chief Complaint: Fever with right-sided abdominal pain  HPI:  [name] is a 29 year old female 2 months postpartum from a C-section; who presented initially to the ED with a six-day history of fever and most recently right-sided abdominal pain. Symptoms initially started with fever and chills intermittently. Notes that she just thought that she likely had the flu. Temperatures noted at home to be  as high as 103F when taken orally. Patient reported alternating between Tylenol and Motrin to decrease fever.  4 days ago, she started to develop right upper quadrant and periumbilical pain that she describes as sharp in nature. Symptoms waxed and waned. Denied any headache, recent sick contacts, dysuria, or urinary frequency symptoms. 2 days ago she went to urgent care and was told that she likely had a virus and discharged home without any medications. She went home and continued to have intermittent fevers and notes additional symptoms of nausea, vomiting, and diarrhea. Symptoms appeared to be getting worse and more constant therefore she went for [facility] for further evaluation. She was worried that it was secondary to her C-section, but notes no redness or swelling around the surgical site. Patient notes that she has not had a appendectomy or cholecystectomy. Upon admission to [facility] she was seen to have tachycardia HR  130's,  fever 102.21F, and lactic acid level of 3.48. CT of the abdomen showed findings consistent with a right pyelonephritis without hydronephrosis or renal obstruction. IV Rocephin was started and the patient was transferred to the hospital. She also had replacement of potassium as levels were 2.4 on admission.     Review of Systems  Constitutional: Positive for fever, chills and  malaise/fatigue.  HENT: Negative for hearing loss and tinnitus.   Eyes: Negative for double vision and pain.  Respiratory: Positive for shortness of breath. Negative for cough, sputum production and wheezing.   Cardiovascular: Negative for chest pain and palpitations.  Gastrointestinal: Positive for abdominal pain and diarrhea. Negative for blood in stool and melena.  Genitourinary: Negative for dysuria and frequency.  Musculoskeletal: Positive for back pain. Negative for neck pain.  Skin: Negative for itching and rash.  Neurological: Negative for speech change and focal weakness.  Endo/Heme/Allergies: Negative for environmental allergies and polydipsia.  Psychiatric/Behavioral: Negative for hallucinations. The patient is not nervous/anxious.         Past Medical History  Diagnosis Date  . Depression   . Complication of anesthesia     spinal anes- unsuccessful for 1st CS  . Family history of adverse reaction to anesthesia     mother - difficult intubation     Past Surgical History  Procedure Laterality Date  . Dilation and curettage of uterus    . Cesarean section      x1  . Mouth surgery    . Cesarean section N/A 02/16/2015    Procedure: REPEAT CESAREAN SECTION;  Surgeon: Amy ReedsKendra Ross, MD;  Location: [facility];  Service: Obstetrics;  Laterality: N/A;      Social History:  reports that she has never smoked. She has never used smokeless tobacco. She reports that she does not drink alcohol or use illicit drugs. Where does patient live--home  and with whom if at home? With husband Can patient participate in ADLs? yes  Allergies  Allergen Reactions  . Dilaudid [Hydromorphone Hcl]  Itching    Family History  Problem Relation Age of Onset  . Diabetes Mother   . Hypertension Mother   . Hyperlipidemia Father   . Hypertension Father   . Cancer Maternal Grandmother   . Diabetes Maternal Grandmother   . Heart disease Paternal Grandmother     MI       Prior to Admission  medications   Medication Sig Start Date End Date Taking? Authorizing Provider  FLUoxetine (PROZAC) 20 MG tablet Take 20 mg by mouth daily.    Historical Provider, MD  ibuprofen (ADVIL,MOTRIN) 600 MG tablet Take 1 tablet (600 mg total) by mouth every 6 (six) hours as needed. 02/18/15   Amy Reeds, MD  levonorgestrel-ethinyl estradiol (PORTIA-28) 0.15-30 MG-MCG tablet Take 1 tablet by mouth daily.    Historical Provider, MD  oxyCODONE-acetaminophen (ROXICET) 5-325 MG tablet Take 1-2 tablets by mouth every 4 (four) hours as needed for severe pain. 02/18/15   Amy Reeds, MD  Prenatal Vit-Fe Fumarate-FA (PRENATAL MULTIVITAMIN) TABS tablet Take 1 tablet by mouth daily at 12 noon.    Historical Provider, MD     Physical Exam: Filed Vitals:   04/24/15 1730 04/24/15 1809 04/24/15 1907 04/24/15 2126  BP: 136/87 123/70 119/72   Pulse: 125 132 123 102  Temp:  102.9 F (39.4 C) 101.2 F (38.4 C) 99 F (37.2 C)  TempSrc:  Oral Oral Oral  Resp: 20 20 16    Height:      Weight:      SpO2: 100% 100% 97%      Constitutional: Vital signs reviewed. Patient is sick appearing, but nontoxic. no acute Cooperative with exam. Alert and oriented x3.  Head: Normocephalic and atraumatic  Ear: TM normal bilaterally  Mouth: no erythema or exudates, MMM  Eyes: PERRL, EOMI, conjunctivae normal, No scleral icterus.  Neck: Supple, Trachea midline normal ROM, No JVD, mass, thyromegaly, or carotid bruit present.  Cardiovascular: RRR, S1 normal, S2 normal, no MRG, pulses symmetric and intact bilaterally  Pulmonary/Chest: CTAB, no wheezes, rales, or rhonchi  Abdominal: Soft. Tenderness to palpation over the right lower quadrant, non-distended, bowel sounds are normal, no masses, organomegaly, or guarding present.  GU:+ right-sided  CVA tenderness Musculoskeletal: No joint deformities, erythema, or stiffness, ROM full and no nontender Ext: no edema and no cyanosis, pulses palpable bilaterally (DP and PT)  Hematology:  no cervical, inginal, or axillary adenopathy.  Neurological: A&O x3, Strenght is normal and symmetric bilaterally, cranial nerve II-XII are grossly intact, no focal motor deficit, sensory intact to light touch bilaterally.  Skin: Warm, diaphoretic and intact. No rash, cyanosis, or clubbing.  Psychiatric: Normal mood and affect. speech and behavior is normal. Judgment and thought content normal. Cognition and memory are normal.      Data Review   Micro Results Recent Results (from the past 240 hour(s))  Culture, Group A Strep     Status: None   Collection Time: 04/22/15  4:27 PM  Result Value Ref Range Status   Strep A Culture Negative  Final    Comment: (NOTE) Performed At: [facility] [address] [place], [place] [id number] Mila Homer MD WU:[id number]   Blood Culture (routine x 2)     Status: None (Preliminary result)   Collection Time: 04/24/15 11:55 AM  Result Value Ref Range Status   Specimen Description BLOOD RIGHT ANTECUBITAL  Final   Special Requests BOTTLES DRAWN AEROBIC AND ANAEROBIC [facility] EACH  Final   Culture PENDING  Incomplete   Report  Status PENDING  Incomplete  Blood Culture (routine x 2)     Status: None (Preliminary result)   Collection Time: 04/24/15 12:45 PM  Result Value Ref Range Status   Specimen Description BLOOD LEFT ANTECUBITAL  Final   Special Requests BOTTLES DRAWN AEROBIC AND ANAEROBIC 5CC EACH  Final   Culture PENDING  Incomplete   Report Status PENDING  Incomplete    Radiology Reports Dg Chest 2 View  04/24/2015  CLINICAL DATA:  Fever and tachycardia EXAM: CHEST  2 VIEW COMPARISON:  None. FINDINGS: Normal heart size and mediastinal contours. No acute infiltrate or edema. No effusion or pneumothorax. No acute osseous findings. IMPRESSION: Negative chest. Electronically Signed   By: Marnee Spring M.D.   On: 04/24/2015 14:19   Ct Abdomen Pelvis W Contrast  04/24/2015  CLINICAL DATA:  Acute right lower quadrant abdominal pain,  fever, nausea. EXAM: CT ABDOMEN AND PELVIS WITH CONTRAST TECHNIQUE: Multidetector CT imaging of the abdomen and pelvis was performed using the standard protocol following bolus administration of intravenous contrast. CONTRAST:  OMNIPAQUE IOHEXOL 300 MG/ML SOLN, 25mL OMNIPAQUE IOHEXOL 300 MG/ML SOLN COMPARISON:  None. FINDINGS: Visualized lung bases appear normal. No significant osseous abnormality is noted. No gallstones are noted. The liver, spleen and pancreas appear normal. Adrenal glands appear normal. Left kidney appears normal. No hydronephrosis or renal obstruction is noted. No renal or ureteral calculi are noted. The right kidney demonstrates hazy contours with minimal surrounding fluid most consistent with pyelonephritis. The appendix appears normal. There is no evidence of bowel obstruction. No abnormal fluid collection is noted. Uterus and ovaries appear normal. Urinary bladder appears normal. No significant adenopathy is noted. IMPRESSION: Findings most consistent with right-sided pyelonephritis. No hydronephrosis or renal obstruction is noted. No renal or ureteral calculi are noted. Electronically Signed   By: Lupita Raider, M.D.   On: 04/24/2015 14:25     CBC  Recent Labs Lab 04/24/15 1155  WBC 7.9  HGB 12.5  HCT 36.9  PLT 196  MCV 82.0  MCH 27.8  MCHC 33.9  RDW 16.4*  LYMPHSABS 0.7  MONOABS 0.6  EOSABS 0.0  BASOSABS 0.0    Chemistries   Recent Labs Lab 04/24/15 1155 04/24/15 2030  NA 137 141  K 2.7* 3.6  CL 103 113*  CO2 22 19*  GLUCOSE 163* 151*  BUN 6 <5*  CREATININE 0.67 0.59  CALCIUM 8.7* 7.9*   ------------------------------------------------------------------------------------------------------------------ estimated creatinine clearance is 101.1 mL/min (by C-G formula based on Cr of 0.59). ------------------------------------------------------------------------------------------------------------------ No results for input(s): HGBA1C in the last 72  hours. ------------------------------------------------------------------------------------------------------------------ No results for input(s): CHOL, HDL, LDLCALC, TRIG, CHOLHDL, LDLDIRECT in the last 72 hours. ------------------------------------------------------------------------------------------------------------------ No results for input(s): TSH, T4TOTAL, T3FREE, THYROIDAB in the last 72 hours.  Invalid input(s): FREET3 ------------------------------------------------------------------------------------------------------------------ No results for input(s): VITAMINB12, FOLATE, FERRITIN, TIBC, IRON, RETICCTPCT in the last 72 hours.  Coagulation profile No results for input(s): INR, PROTIME in the last 168 hours.  No results for input(s): DDIMER in the last 72 hours.  Cardiac Enzymes No results for input(s): CKMB, TROPONINI, MYOGLOBIN in the last 168 hours.  Invalid input(s): CK ------------------------------------------------------------------------------------------------------------------ Invalid input(s): POCBNP   CBG: No results for input(s): GLUCAP in the last 168 hours.     ZOX:[id number]    Assessment/Plan Principal Problem:   Severe sepsis secondary to pyelonephritis: Acute. Patient with complaints of right-sided abdominal pain and intermittent fevers for the last 6 days. On admission patient tachycardia HR  130's,  fever 102.47F, and lactic  acid level of 3.48. CT of the abdomen showed findings consistent with a right pyelonephritis without hydronephrosis or renal obstruction. -Follow-up urine cultures  -Continue IV Rocephin and de-escalate antibiotics when able -Normal saline IV fluids at 125 mL per hour -Tylenol as needed for fever -Repeat lactic acid  Nausea, vomiting, and diarrhea: Most likely related to the acute infection. Presenting symptoms likely a cause for electrolyte derangements including hypokalemia. -IV fluids as seen above -Strict ins and  outs -Zofran as needed for nausea vomiting   -Advance diet as tolerated -Replace electrolytes as needed  Fever presenting with conditions classified elsewhere: Secondary to above -see above    Hypokalemia: Initially, potassium 2.4 on admission. Patient was given -Recheck BMP in a.m.   -Replace as needed  Hyperglycemia: Acute. Blood glucose 151 on admission. Likely related to a acute stress response. -Continue to monitor glucose and place on sliding scale insulin if needed    Tachycardia: Acute. Appears to be sinus with intermittent PVCs. Heart rates initially in the 130s suspect secondary to the acute infection and extravascular losses. -Checking EKG -Monitor on telemetry overnight     Code Status:   full Family Communication: bedside Disposition Plan: admit   Total time spent 55 minutes.Greater than 50% of this time was spent in counseling, explanation of diagnosis, planning of further management, and coordination of care  Clydie Braun [facility] Pager [phone]  If 7PM-7AM, please contact night-coverage [email or web address] Password [email or web address] 04/24/2015, 10:28 PM

## 2015-04-24 NOTE — ED Notes (Signed)
Report given to French Anaracy RN with [facility] 5 th floor.

## 2015-04-24 NOTE — ED Notes (Signed)
[facility] here to transfer pt to [facility]. 

## 2015-04-24 NOTE — ED Notes (Signed)
Pt c/o shivering and requesting tylenol. PA aware. Tylenol ordered.

## 2015-04-24 NOTE — ED Notes (Signed)
Patient is resting comfortably. 

## 2015-04-24 NOTE — ED Notes (Signed)
Pt husband ordered out for pt to eat dinner. Tolerated well.

## 2015-04-24 NOTE — ED Notes (Signed)
Amy Jackson ClientHannah PA aware of temp. Orders given.

## 2015-04-24 NOTE — ED Notes (Signed)
Family at bedside. 

## 2015-04-24 NOTE — ED Notes (Signed)
[facility] with [facility].

## 2015-04-24 NOTE — ED Provider Notes (Signed)
CSN: [id number]     Arrival date & time 04/24/15  1133 History   First MD Initiated Contact with Patient 04/24/15 1208     Chief Complaint  Patient presents with  . Fever     (Consider location/radiation/quality/duration/timing/severity/associated sxs/prior Treatment) Patient is a 29 y.o. female presenting with fever. The history is provided by the patient and medical records. No language interpreter was used.  Fever Associated symptoms: vomiting ( x1, NBNB)   Associated symptoms: no chest pain, no cough, no diarrhea, no dysuria, no headaches, no nausea and no rash      Amy Jackson is a 29 y.o. female  with a hx of c-section (8 weeks ago) presents to the Emergency Department complaining of gradual, persistent, progressively worsening nausea, high fevers to 103, RLQ and periumbilical abd pain onset 6 days ago. Associated symptoms include decreased appetite.  Pt reports that symptoms are better during the middle of the day.  She has been treating with Tylenol and Ibuprofen with moderate relief.  Pt reports that at the onset she had neck pain and headache but these symptoms have resolved.  Pt reports her sister-in-law was sick was a viral URI without associated symptoms.  Pt reports 1 episode of vomiting in the last 6 days that included stomach contents.  Pt denies dysuria.  Pt reports she is currently menstruating but has not noticed any vaginal discharge.   Pt reports routine c-section 8 weeks ago during which her bladder was stitched.  Pt reports her HR is normally >100 and has been drinking lots of caffeine Pepsi today.  Pt had f/u with OB/GYn on Nov 1st with routine healing of her c-section.  Surgical Hx includes c-section x 2 and D&C.  No hx of appendectomy or cholecystectomy.     Past Medical History  Diagnosis Date  . Depression   . Complication of anesthesia     spinal anes- unsuccessful for 1st CS  . Family history of adverse reaction to anesthesia     mother - difficult  intubation   Past Surgical History  Procedure Laterality Date  . Dilation and curettage of uterus    . Cesarean section      x1  . Mouth surgery    . Cesarean section N/A 02/16/2015    Procedure: REPEAT CESAREAN SECTION;  Surgeon: Waynard Reeds, MD;  Location: [facility];  Service: Obstetrics;  Laterality: N/A;   Family History  Problem Relation Age of Onset  . Diabetes Mother   . Hypertension Mother   . Hyperlipidemia Father   . Hypertension Father   . Cancer Maternal Grandmother   . Diabetes Maternal Grandmother   . Heart disease Paternal Grandmother     MI   Social History  Substance Use Topics  . Smoking status: Never Smoker   . Smokeless tobacco: Never Used  . Alcohol Use: No   OB History    Gravida Para Term Preterm AB TAB SAB Ectopic Multiple Living   0 1     Review of Systems  Constitutional: Positive for fever. Negative for diaphoresis, appetite change, fatigue and unexpected weight change.  HENT: Negative for mouth sores.   Eyes: Negative for visual disturbance.  Respiratory: Negative for cough, chest tightness, shortness of breath and wheezing.   Cardiovascular: Negative for chest pain.  Gastrointestinal: Positive for vomiting ( x1, NBNB) and abdominal pain. Negative for nausea, diarrhea and constipation.  Endocrine: Negative for polydipsia, polyphagia and polyuria.  Genitourinary: Negative for dysuria, urgency, frequency and hematuria.  Musculoskeletal: Negative for back pain and neck stiffness.  Skin: Negative for rash.  Allergic/Immunologic: Negative for immunocompromised state.  Neurological: Negative for syncope, light-headedness and headaches.  Hematological: Does not bruise/bleed easily.  Psychiatric/Behavioral: Negative for sleep disturbance. The patient is not nervous/anxious.       Allergies  Dilaudid  Home Medications   Prior to Admission medications   Medication Sig Start Date End Date Taking? Authorizing Provider  FLUoxetine  (PROZAC) 20 MG tablet Take 20 mg by mouth daily.    Historical Provider, MD  ibuprofen (ADVIL,MOTRIN) 600 MG tablet Take 1 tablet (600 mg total) by mouth every 6 (six) hours as needed. 02/18/15   Waynard ReedsKendra Ross, MD  levonorgestrel-ethinyl estradiol (PORTIA-28) 0.15-30 MG-MCG tablet Take 1 tablet by mouth daily.    Historical Provider, MD  oxyCODONE-acetaminophen (ROXICET) 5-325 MG tablet Take 1-2 tablets by mouth every 4 (four) hours as needed for severe pain. 02/18/15   Waynard ReedsKendra Ross, MD  Prenatal Vit-Fe Fumarate-FA (PRENATAL MULTIVITAMIN) TABS tablet Take 1 tablet by mouth daily at 12 noon.    Historical Provider, MD   BP 115/76 mmHg  Pulse 97  Temp(Src) 98 F (36.7 C) (Oral)  Resp 26  Ht 5\' 5"  (1.651 m)  Wt 68.765 kg  BMI 25.23 kg/m2  SpO2 100%  LMP 04/20/2015 Physical Exam  Constitutional: She appears well-developed and well-nourished. No distress.  Awake, alert, ill appearing Diaphoretic  HENT:  Head: Normocephalic and atraumatic.  Mouth/Throat: Oropharynx is clear and moist. No oropharyngeal exudate.  Eyes: Conjunctivae are normal. No scleral icterus.  Neck: Normal range of motion. Neck supple.  Cardiovascular: Regular rhythm, S1 normal, S2 normal, normal heart sounds and intact distal pulses.  Tachycardia present.   Pulmonary/Chest: Effort normal and breath sounds normal. No respiratory distress. She has no wheezes.  Equal chest expansion Clear and equal breath sounds  Abdominal: Soft. Bowel sounds are normal. She exhibits no distension and no mass. There is tenderness in the right lower quadrant and periumbilical area. There is guarding and CVA tenderness. There is no rebound.  Right side, periumbilical and right lower abdominal tenderness with guarding No rebound tenderness Mild right-sided CVA tenderness; no left-sided CVA tenderness Surgical cesarean incision is well healing without erythema, induration, tenderness or increased warmth  Musculoskeletal: Normal range of motion.  She exhibits no edema.  Neurological: She is alert.  Speech is clear and goal oriented Moves extremities without ataxia  Skin: Skin is warm. She is diaphoretic.  Psychiatric: She has a normal mood and affect.  Nursing note and vitals reviewed.   ED Course  Procedures (including critical care time) Labs Review Labs Reviewed  CBC WITH DIFFERENTIAL/PLATELET - Abnormal; Notable for the following:    RDW 16.4 (*)    All other components within normal limits  BASIC METABOLIC PANEL - Abnormal; Notable for the following:    Potassium 2.7 (*)    Glucose, Bld 163 (*)    Calcium 8.7 (*)    All other components within normal limits  URINALYSIS, ROUTINE W REFLEX MICROSCOPIC (NOT AT [facility]) - Abnormal; Notable for the following:    APPearance CLOUDY (*)    Glucose, UA 250 (*)    Hgb urine dipstick LARGE (*)    Protein, ur 100 (*)    Nitrite POSITIVE (*)    Leukocytes, UA SMALL (*)    All other components within normal limits  URINE MICROSCOPIC-ADD ON - Abnormal; Notable for the following:  Squamous Epithelial / LPF 0-5 (*)    Bacteria, UA MANY (*)    All other components within normal limits  I-STAT CG4 LACTIC ACID, ED - Abnormal; Notable for the following:    Lactic Acid, Venous 3.28 (*)    All other components within normal limits  I-STAT CG4 LACTIC ACID, ED - Abnormal; Notable for the following:    Lactic Acid, Venous 0.41 (*)    All other components within normal limits  CULTURE, BLOOD (ROUTINE X 2)  CULTURE, BLOOD (ROUTINE X 2)  URINE CULTURE  PREGNANCY, URINE  INFLUENZA PANEL BY PCR (TYPE A & B, H1N1)    Imaging Review Dg Chest 2 View  04/24/2015  CLINICAL DATA:  Fever and tachycardia EXAM: CHEST  2 VIEW COMPARISON:  None. FINDINGS: Normal heart size and mediastinal contours. No acute infiltrate or edema. No effusion or pneumothorax. No acute osseous findings. IMPRESSION: Negative chest. Electronically Signed   By: Marnee Spring M.D.   On: 04/24/2015 14:19   Ct Abdomen  Pelvis W Contrast  04/24/2015  CLINICAL DATA:  Acute right lower quadrant abdominal pain, fever, nausea. EXAM: CT ABDOMEN AND PELVIS WITH CONTRAST TECHNIQUE: Multidetector CT imaging of the abdomen and pelvis was performed using the standard protocol following bolus administration of intravenous contrast. CONTRAST:  OMNIPAQUE IOHEXOL 300 MG/ML SOLN, 25mL OMNIPAQUE IOHEXOL 300 MG/ML SOLN COMPARISON:  None. FINDINGS: Visualized lung bases appear normal. No significant osseous abnormality is noted. No gallstones are noted. The liver, spleen and pancreas appear normal. Adrenal glands appear normal. Left kidney appears normal. No hydronephrosis or renal obstruction is noted. No renal or ureteral calculi are noted. The right kidney demonstrates hazy contours with minimal surrounding fluid most consistent with pyelonephritis. The appendix appears normal. There is no evidence of bowel obstruction. No abnormal fluid collection is noted. Uterus and ovaries appear normal. Urinary bladder appears normal. No significant adenopathy is noted. IMPRESSION: Findings most consistent with right-sided pyelonephritis. No hydronephrosis or renal obstruction is noted. No renal or ureteral calculi are noted. Electronically Signed   By: Lupita Raider, M.D.   On: 04/24/2015 14:25   I have personally reviewed and evaluated these images and lab results as part of my medical decision-making.   EKG Interpretation None      MDM   Final diagnoses:  Pyelonephritis  Sepsis, due to unspecified organism (HCC)  Fever, unspecified fever cause  Tachycardia   Annett Fabian presents with fever, diaphoresis, tachycardia. Record review shows the patient was evaluated for the same 2 days ago. At that time she was diagnosed with influenza by clinical evaluation.  Her urine at that time had positive nitrites and leukocytes however no antibiotics were initiated.  Patient denied urinary symptoms at that visit and continues to deny  urinary symptoms today.  On evaluation patient with elevated lactic acid at 2.3. No leukocytosis however urinalysis returns again with positive nitrites and leukocytes, 6-30 white blood cells and many bacteria.  She has significant hypokalemia at 2.7 and no elevation in her serum creatinine. Chest x-rays without evidence of pneumonia.  CT scan obtained due to concern for possible intra-abdominal infection as she is 8 weeks status post cesarean section. CT scan shows findings consistent with right-sided pyelonephritis without hydronephrosis or renal obstruction. No evidence of renal calculi. Patient given Rocephin. Blood and urine cultures are pending. Patient also given oral and IV potassium.  Discussed with triad hospitalist Dr. Jerral Ralph who will admit to MedSurg.  BP 115/76 mmHg  Pulse  97  Temp(Src) 98 F (36.7 C) (Oral)  Resp 26  Ht  (1.651 m)  Wt 68.765 kg  BMI 25.23 kg/m2  SpO2 100%  LMP 04/20/2015   Dierdre Forth, PA-C 04/24/15 1544  Geoffery Lyons, MD 04/26/15 [phone]

## 2015-04-24 NOTE — Progress Notes (Signed)
Call[facility]: 29 yo-8 weeks post partum-C Section site looks great per ED PA-C-coming in with sepsis from Pyelonephritis. Initially tachycardic and lactate elevated-but with IVF/Rocephin-lactate normal. K being supplemented. Accepted to med surg bed. Although post partum-not breast feeding.

## 2015-04-24 NOTE — ED Notes (Signed)
PT up to BR with RT. On arrival back to bed placed back on cardiac monitor HR ST 143 with PVC's every 5th beat. Pt shivering. Pt after resting HR St 120's and no PVC's noted. Dahlia ClientHannah PA aware.

## 2015-04-25 ENCOUNTER — Encounter (HOSPITAL_COMMUNITY): Payer: Self-pay | Admitting: *Deleted

## 2015-04-25 DIAGNOSIS — R509 Fever, unspecified: Secondary | ICD-10-CM | POA: Diagnosis not present

## 2015-04-25 DIAGNOSIS — R6520 Severe sepsis without septic shock: Secondary | ICD-10-CM | POA: Diagnosis present

## 2015-04-25 DIAGNOSIS — R5081 Fever presenting with conditions classified elsewhere: Secondary | ICD-10-CM | POA: Diagnosis not present

## 2015-04-25 DIAGNOSIS — Z79899 Other long term (current) drug therapy: Secondary | ICD-10-CM | POA: Diagnosis not present

## 2015-04-25 DIAGNOSIS — R739 Hyperglycemia, unspecified: Secondary | ICD-10-CM | POA: Diagnosis not present

## 2015-04-25 DIAGNOSIS — N12 Tubulo-interstitial nephritis, not specified as acute or chronic: Secondary | ICD-10-CM | POA: Diagnosis present

## 2015-04-25 DIAGNOSIS — E876 Hypokalemia: Secondary | ICD-10-CM

## 2015-04-25 DIAGNOSIS — A419 Sepsis, unspecified organism: Secondary | ICD-10-CM | POA: Diagnosis not present

## 2015-04-25 DIAGNOSIS — B9689 Other specified bacterial agents as the cause of diseases classified elsewhere: Secondary | ICD-10-CM | POA: Diagnosis present

## 2015-04-25 DIAGNOSIS — A4151 Sepsis due to Escherichia coli [E. coli]: Secondary | ICD-10-CM | POA: Diagnosis present

## 2015-04-25 DIAGNOSIS — F329 Major depressive disorder, single episode, unspecified: Secondary | ICD-10-CM | POA: Diagnosis present

## 2015-04-25 LAB — LACTIC ACID, PLASMA
Lactic Acid, Venous: 0.9 mmol/L (ref 0.5–2.0)
Lactic Acid, Venous: 1.9 mmol/L (ref 0.5–2.0)

## 2015-04-25 LAB — BASIC METABOLIC PANEL
ANION GAP: 8 (ref 5–15)
BUN: <5 mg/dL — ABNORMAL LOW (ref 6–20)
CO2: 21 mmol/L — ABNORMAL LOW (ref 22–32)
Calcium: 7.5 mg/dL — ABNORMAL LOW (ref 8.9–10.3)
Chloride: 111 mmol/L (ref 101–111)
Creatinine, Ser: 0.53 mg/dL (ref 0.44–1.00)
GFR calc Af Amer: >60 mL/min (ref >60)
Glucose, Bld: 115 mg/dL — ABNORMAL HIGH (ref 65–99)
POTASSIUM: 3.4 mmol/L — AB (ref 3.5–5.1)
SODIUM: 140 mmol/L (ref 135–145)

## 2015-04-25 LAB — CBC
HCT: 31.1 % — ABNORMAL LOW (ref 36.0–46.0)
Hemoglobin: 10.5 g/dL — ABNORMAL LOW (ref 12.0–15.0)
MCH: 28.2 pg (ref 26.0–34.0)
MCHC: 33.8 g/dL (ref 30.0–36.0)
MCV: 83.4 fL (ref 78.0–100.0)
PLATELETS: 147 10*3/uL — AB (ref 150–400)
RBC: 3.73 MIL/uL — AB (ref 3.87–5.11)
RDW: 16.6 % — AB (ref 11.5–15.5)
WBC: 7.3 10*3/uL (ref 4.0–10.5)

## 2015-04-25 LAB — MAGNESIUM: MAGNESIUM: 1.6 mg/dL — AB (ref 1.7–2.4)

## 2015-04-25 MED ORDER — MAGNESIUM SULFATE 2 GM/50ML IV SOLN
2.0000 g | Freq: Once | INTRAVENOUS | Status: AC
  Administered 2015-04-25: 2 g via INTRAVENOUS
  Filled 2015-04-25: qty 50

## 2015-04-25 MED ORDER — POTASSIUM CHLORIDE CRYS ER 20 MEQ PO TBCR
40.0000 meq | EXTENDED_RELEASE_TABLET | Freq: Once | ORAL | Status: AC
  Administered 2015-04-25: 40 meq via ORAL
  Filled 2015-04-25: qty 2

## 2015-04-25 MED ORDER — ONDANSETRON HCL 4 MG/2ML IJ SOLN
4.0000 mg | Freq: Four times a day (QID) | INTRAMUSCULAR | Status: DC | PRN
  Administered 2015-04-25: 4 mg via INTRAVENOUS
  Filled 2015-04-25: qty 2

## 2015-04-25 MED ORDER — ENOXAPARIN SODIUM 40 MG/0.4ML ~~LOC~~ SOLN
40.0000 mg | SUBCUTANEOUS | Status: DC
  Administered 2015-04-25 – 2015-04-26 (×2): 40 mg via SUBCUTANEOUS
  Filled 2015-04-25 (×2): qty 0.4

## 2015-04-25 MED ORDER — IBUPROFEN 400 MG PO TABS
400.0000 mg | ORAL_TABLET | Freq: Once | ORAL | Status: AC
  Administered 2015-04-25: 400 mg via ORAL
  Filled 2015-04-25: qty 1

## 2015-04-25 MED ORDER — SODIUM CHLORIDE 0.9 % IV BOLUS (SEPSIS)
1000.0000 mL | Freq: Once | INTRAVENOUS | Status: AC
  Administered 2015-04-25: 1000 mL via INTRAVENOUS

## 2015-04-25 NOTE — Progress Notes (Signed)
[facility]                                                                              Patient Demographics  Amy Jackson, is a 29 y.o. female, DOB - August 13, 1985, WUJ:[id number]  Admit date - 04/24/2015   Admitting Physician Clydie Braun, MD  Outpatient Primary MD for the patient is Rudi Heap, MD  LOS -    Chief Complaint  Patient presents with  . Fever       Brief HPI   Amy Jackson is a 29 year old female 2 months postpartum from a C-section; who presented initially to the ED with a six-day history of fever and right-sided abdominal pain. Symptoms initially started with fever and chills, high as 103F, also associated with nausea, vomiting and diarrhea..In [facility] [place] she was seen to have HR 130's, fever 102.27F, and lactic acid level of 3.48. CT of the abdomen showed findings consistent with a right pyelonephritis without hydronephrosis or renal obstruction. IV Rocephin was started and the patient was transferred to [facility].   Assessment & Plan    Principal Problem:   Severe sepsis (HCC) secondary to UTI, pyelonephritis, right-sided - Follow urine cultures, blood cultures, continue IV Rocephin (patient reports that she is not breast-feeding). - Continue gentle hydration, pain control  Active Problems:    Hypokalemia -Replaced aggressively last night, overly replaced this morning     Nausea vomiting and diarrhea - Improving - Continue gentle hydration   Code Status: Full code   Family Communication: Discussed in detail with the patient, all imaging results, lab results explained to the patientand husband at bedside   Time Spent in minutes   25 minutes  Procedures  None   Consults   None   DVT Prophylaxis  Lovenox  Medications  Scheduled Meds: . cefTRIAXone (ROCEPHIN)  IV  2 g Intravenous Q24H  . enoxaparin (LOVENOX) injection  40 mg Subcutaneous Q24H  . FLUoxetine  20 mg Oral Daily  . sodium chloride  3 mL Intravenous  Q12H   Continuous Infusions: . sodium chloride 125 mL/hr at 04/24/15 1958   PRN Meds:.acetaminophen **OR** acetaminophen, HYDROcodone-acetaminophen, morphine injection   Antibiotics   Anti-infectives    Start     Dose/Rate Route Frequency Ordered Stop   04/25/15 2000  cefTRIAXone (ROCEPHIN) 1 g in dextrose 5 % 50 mL IVPB  Status:  Discontinued     1 g 100 mL/hr over 30 Minutes Intravenous Every 24 hours 04/24/15 1929 04/24/15 1932   04/25/15 2000  cefTRIAXone (ROCEPHIN) 2 g in dextrose 5 % 50 mL IVPB     2 g 100 mL/hr over 30 Minutes Intravenous Every 24 hours 04/24/15 1932     04/25/15 1700  cefTRIAXone (ROCEPHIN) 2 g in dextrose 5 % 50 mL IVPB  Status:  Discontinued     2 g 100 mL/hr over 30 Minutes Intravenous  Once 04/24/15 1927 04/24/15 1928   04/24/15 1930  cefTRIAXone (ROCEPHIN) 1 g in dextrose 5 % 50 mL IVPB     1 g 100 mL/hr over 30 Minutes Intravenous  Once 04/24/15 1929 04/24/15 2202   04/24/15 1445  cefTRIAXone (ROCEPHIN) 1 g in dextrose 5 % 50 mL IVPB     1 g 100 mL/hr over 30 Minutes Intravenous  Once 04/24/15 1439 04/24/15 1522   04/24/15 1443  cefTRIAXone (ROCEPHIN) 1 G injection    Comments:  Amy Jackson   : cabinet override      04/24/15 1443 04/24/15 1504        Subjective:   Amy Jackson was seen and examined today.  Still having right-sided back pain and flank pain, no fevers this morning, no nausea vomiting or diarrhea. Patient denies dizziness, chest pain, shortness of breath, N/V/D/C, new weakness, numbess, tingling.  Objective:   Blood pressure 99/61, pulse 92, temperature 98.2 F (36.8 C), temperature source Oral, resp. rate 20, height  (1.651 m), weight 68.765 kg (151 lb 9.6 oz), last menstrual period 04/20/2015, SpO2 100 %, unknown if currently breastfeeding.  Wt Readings from Last 3 Encounters:  04/24/15 68.765 kg (151 lb 9.6 oz)  02/14/15 80.287 kg (177 lb)  05/25/14 58.684 kg (129 lb 6 oz)     Intake/Output Summary (Last 24  hours) at 04/25/15 1231 Last data filed at 04/25/15 0500  Gross per 24 hour  Intake      0 ml  Output   1100 ml  Net  -1100 ml    Exam  General: Alert and oriented x 3, NAD  HEENT:  PERRLA, EOMI, Anicteric Sclera, mucous membranes moist.   Neck: Supple, no JVD, no masses  CVS: S1 S2 auscultated, no rubs, murmurs or gallops. Regular rate and rhythm.  Respiratory: Clear to auscultation bilaterally, no wheezing, rales or rhonchi  Abdomen: Soft, nondistended, + bowel sounds, Right CVAT   Ext: no cyanosis clubbing or edema  Neuro: AAOx3, Cr N's II- XII. Strength 5/5 upper and lower extremities bilaterally  Skin: No rashes  Psych: Normal affect and demeanor, alert and oriented x3    Data Review   Micro Results Recent Results (from the past 240 hour(s))  Culture, Group A Strep     Status: None   Collection Time: 04/22/15  4:27 PM  Result Value Ref Range Status   Strep A Culture Negative  Final    Comment: (NOTE) Performed At: [facility] [address] [place], [place] [id number] Mila Homer MD WU:[id number]   Blood Culture (routine x 2)     Status: None (Preliminary result)   Collection Time: 04/24/15 11:55 AM  Result Value Ref Range Status   Specimen Description BLOOD RIGHT ANTECUBITAL  Final   Special Requests BOTTLES DRAWN AEROBIC AND ANAEROBIC 5CC EACH  Final   Culture PENDING  Incomplete   Report Status PENDING  Incomplete  Blood Culture (routine x 2)     Status: None (Preliminary result)   Collection Time: 04/24/15 12:45 PM  Result Value Ref Range Status   Specimen Description BLOOD LEFT ANTECUBITAL  Final   Special Requests BOTTLES DRAWN AEROBIC AND ANAEROBIC 5CC EACH  Final   Culture PENDING  Incomplete   Report Status PENDING  Incomplete    Radiology Reports Dg Chest 2 View  04/24/2015  CLINICAL DATA:  Fever and tachycardia EXAM: CHEST  2 VIEW COMPARISON:  None. FINDINGS: Normal heart size and mediastinal contours. No acute infiltrate or  edema. No effusion or pneumothorax. No acute osseous findings. IMPRESSION: Negative chest. Electronically Signed   By: Marnee Spring M.D.   On: 04/24/2015 14:19   Ct Abdomen Pelvis W Contrast  04/24/2015  CLINICAL DATA:  Acute right lower quadrant  abdominal pain, fever, nausea. EXAM: CT ABDOMEN AND PELVIS WITH CONTRAST TECHNIQUE: Multidetector CT imaging of the abdomen and pelvis was performed using the standard protocol following bolus administration of intravenous contrast. CONTRAST:  100mL OMNIPAQUE IOHEXOL 300 MG/ML SOLN, 25mL OMNIPAQUE IOHEXOL 300 MG/ML SOLN COMPARISON:  None. FINDINGS: Visualized lung bases appear normal. No significant osseous abnormality is noted. No gallstones are noted. The liver, spleen and pancreas appear normal. Adrenal glands appear normal. Left kidney appears normal. No hydronephrosis or renal obstruction is noted. No renal or ureteral calculi are noted. The right kidney demonstrates hazy contours with minimal surrounding fluid most consistent with pyelonephritis. The appendix appears normal. There is no evidence of bowel obstruction. No abnormal fluid collection is noted. Uterus and ovaries appear normal. Urinary bladder appears normal. No significant adenopathy is noted. IMPRESSION: Findings most consistent with right-sided pyelonephritis. No hydronephrosis or renal obstruction is noted. No renal or ureteral calculi are noted. Electronically Signed   By: Lupita RaiderJames  Green Jr, M.D.   On: 04/24/2015 14:25    CBC  Recent Labs Lab 04/24/15 1155 04/25/15 0457  WBC 7.9 7.3  HGB 12.5 10.5*  HCT 36.9 31.1*  PLT 196 147*  MCV 82.0 83.4  MCH 27.8 28.2  MCHC 33.9 33.8  RDW 16.4* 16.6*  LYMPHSABS 0.7  --   MONOABS 0.6  --   EOSABS 0.0  --   BASOSABS 0.0  --     Chemistries   Recent Labs Lab 04/24/15 1155 04/24/15 2030 04/24/15 2305 04/25/15 0457  NA 137 141  --  140  K 2.7* 3.6  --  3.4*  CL 103 113*  --  111  CO2 22 19*  --  21*  GLUCOSE 163* 151*  --  115*    BUN 6 <5*  --  <5*  CREATININE 0.67 0.59  --  0.53  CALCIUM 8.7* 7.9*  --  7.5*  MG  --   --  1.6*  --    ------------------------------------------------------------------------------------------------------------------ estimated creatinine clearance is 101.1 mL/min (by C-G formula based on Cr of 0.53). ------------------------------------------------------------------------------------------------------------------ No results for input(s): HGBA1C in the last 72 hours. ------------------------------------------------------------------------------------------------------------------ No results for input(s): CHOL, HDL, LDLCALC, TRIG, CHOLHDL, LDLDIRECT in the last 72 hours. ------------------------------------------------------------------------------------------------------------------ No results for input(s): TSH, T4TOTAL, T3FREE, THYROIDAB in the last 72 hours.  Invalid input(s): FREET3 ------------------------------------------------------------------------------------------------------------------ No results for input(s): VITAMINB12, FOLATE, FERRITIN, TIBC, IRON, RETICCTPCT in the last 72 hours.  Coagulation profile No results for input(s): INR, PROTIME in the last 168 hours.  No results for input(s): DDIMER in the last 72 hours.  Cardiac Enzymes No results for input(s): CKMB, TROPONINI, MYOGLOBIN in the last 168 hours.  Invalid input(s): CK ------------------------------------------------------------------------------------------------------------------ Invalid input(s): POCBNP  No results for input(s): GLUCAP in the last 72 hours.   Jihaad Bruschi M.D. [facility] 04/25/2015, 12:31 PM  Pager: [phone] Between 7am to 7pm - call Pager - [phone]  After 7pm go to [email or web address] - password [email or web address]  Call night coverage person covering after 7pm

## 2015-04-25 NOTE — Progress Notes (Signed)
Notified Dr Isidoro Donningai of positive blood cultures.

## 2015-04-26 DIAGNOSIS — R112 Nausea with vomiting, unspecified: Secondary | ICD-10-CM

## 2015-04-26 DIAGNOSIS — R197 Diarrhea, unspecified: Secondary | ICD-10-CM

## 2015-04-26 LAB — URINE CULTURE: Culture: >=100000

## 2015-04-26 LAB — CALCIUM, IONIZED: CALCIUM, IONIZED, SERUM: 4.6 mg/dL (ref 4.5–5.6)

## 2015-04-26 MED ORDER — MORPHINE SULFATE (PF) 2 MG/ML IV SOLN: 1.0000 mg | INTRAVENOUS | Status: DC | PRN

## 2015-04-26 NOTE — Care Management Note (Signed)
Case Management Note  Patient Details  Name: Amy Jackson MRN: [id number] Date of Birth: 1986-04-19  Subjective/Objective:     Date: 04/26/15 Spoke with patient at the bedside.  Introduced self as Sports coachcase manager and explained role in discharge planning and how to be reached.  Verified [place]. Expressed potential need for no other DME.  Verified patient anticipates to go home with family, at time of discharge and will have full-time supervision by family  at this time to best of their knowledge.  Patient denied needing help with their medication.  Patient drives  to MD appointments.  Verified patient has PCP Rudi Heaponald Moore.   Plan: CM will continue to follow for discharge planning and [facility].                Action/Plan:   Expected Discharge Date:                  Expected Discharge Plan:  Home/Self Care  In-House Referral:     Discharge planning Services  CM Consult  Post Acute Care Choice:    Choice offered to:     DME Arranged:    DME Agency:     HH Arranged:    HH Agency:     Status of Service:  Completed, signed off  Medicare Important Message Given:    Date Medicare IM Given:    Medicare IM give by:    Date Additional Medicare IM Given:    Additional Medicare Important Message give by:     If discussed at Long Length of Stay Meetings, dates discussed:    Additional Comments:  Leone Havenaylor, Jenese Mischke [place], RN 04/26/2015, 1:59 PM

## 2015-04-26 NOTE — ED Notes (Signed)
Chart  Reviewed    Per  Request  Of brent  [place]

## 2015-04-26 NOTE — Progress Notes (Signed)
[facility]                                                                              Patient Demographics  Amy Jackson, is a 29 y.o. female, DOB - [birth year], WUJ:[id number]  Admit date - 04/24/2015   Admitting Physician Clydie Braun, MD  Outpatient Primary MD for the patient is Rudi Heap, MD  LOS - 1   Chief Complaint  Patient presents with  . Fever       Brief HPI   Amy Jackson is a 29 year old female 2 months postpartum from a C-section; who presented initially to the ED with a six-day history of fever and right-sided abdominal pain. Symptoms initially started with fever and chills, high as 103F, also associated with nausea, vomiting and diarrhea..In [facility] she was seen to have HR 130's, fever 102.48F, and lactic acid level of 3.48. CT of the abdomen showed findings consistent with a right pyelonephritis without hydronephrosis or renal obstruction. IV Rocephin was started and the patient was transferred to [facility].   Assessment & Plan    Principal Problem:   Severe sepsis (HCC) secondary to Escherichia coli UTI, pyelonephritis, right-sided, gram-negative bacteremia - Overnight spiked fever - Urine culture showed Escherichia coli, sensitive to Rocephin  - Follow blood cultures and sensitivities, appears to be growing Escherichia coli - Continue IV Rocephin for now, will DC home on oral ciprofloxacin (patient reports that she is not breast-feeding). - Continue gentle hydration, pain control  Active Problems:    Hypokalemia -Recheck in a.m.     Nausea vomiting and diarrhea - Resolved - Continue gentle hydration   Code Status: Full code   Family Communication: Discussed in detail with the patient, all imaging results, lab results explained to the patient and husband at bedside  Disposition: Will DC home tomorrow if no fevers overnight  Time Spent in minutes   25 minutes  Procedures  None   Consults   None   DVT  Prophylaxis  Lovenox  Medications  Scheduled Meds: . cefTRIAXone (ROCEPHIN)  IV  2 g Intravenous Q24H  . enoxaparin (LOVENOX) injection  40 mg Subcutaneous Q24H  . FLUoxetine  20 mg Oral Daily  . sodium chloride  3 mL Intravenous Q12H   Continuous Infusions:   PRN Meds:.acetaminophen **OR** acetaminophen, HYDROcodone-acetaminophen, morphine injection, morphine injection, ondansetron (ZOFRAN) IV   Antibiotics   Anti-infectives    Start     Dose/Rate Route Frequency Ordered Stop   04/25/15 2000  cefTRIAXone (ROCEPHIN) 1 g in dextrose 5 % 50 mL IVPB  Status:  Discontinued     1 g 100 mL/hr over 30 Minutes Intravenous Every 24 hours 04/24/15 1929 04/24/15 1932   04/25/15 2000  cefTRIAXone (ROCEPHIN) 2 g in dextrose 5 % 50 mL IVPB     2 g 100 mL/hr over 30 Minutes Intravenous Every 24 hours 04/24/15 1932     04/25/15 1700  cefTRIAXone (ROCEPHIN) 2 g in dextrose 5 % 50 mL IVPB  Status:  Discontinued     2 g 100 mL/hr over 30 Minutes Intravenous  Once 04/24/15 1927 04/24/15 1928   04/24/15  1930  cefTRIAXone (ROCEPHIN) 1 g in dextrose 5 % 50 mL IVPB     1 g 100 mL/hr over 30 Minutes Intravenous  Once 04/24/15 1929 04/24/15 2202   04/24/15 1445  cefTRIAXone (ROCEPHIN) 1 g in dextrose 5 % 50 mL IVPB     1 g 100 mL/hr over 30 Minutes Intravenous  Once 04/24/15 1439 04/24/15 1522   04/24/15 1443  cefTRIAXone (ROCEPHIN) 1 G injection    Comments:  Lauro Regulus   : cabinet override      04/24/15 1443 04/24/15 1504        Subjective:   Amy Jackson was seen and examined today. Overnight, feeling better now, headache is improving. Right-sided back pain and flank pain improving. No nausea or vomiting.  Patient denies dizziness, chest pain, shortness of breath, N/V/D/C, new weakness, numbess, tingling.  Objective:   Blood pressure 123/80, pulse 85, temperature 98.3 F (36.8 C), temperature source Oral, resp. rate 19, height  (1.651 m), weight 68.765 kg (151 lb 9.6 oz), last  menstrual period 04/20/2015, SpO2 96 %, unknown if currently breastfeeding.  Wt Readings from Last 3 Encounters:  04/24/15 68.765 kg (151 lb 9.6 oz)  02/14/15 80.287 kg (177 lb)  05/25/14 58.684 kg (129 lb 6 oz)     Intake/Output Summary (Last 24 hours) at 04/26/15 1330 Last data filed at 04/26/15 1118  Gross per 24 hour  Intake   3100 ml  Output   3000 ml  Net    100 ml    Exam  General: Alert and oriented x 3, NAD  HEENT:  PERRLA, EOMI,   Neck: Supple, no JVD, no masses  CVS: S1 S2 clear, RRR  Respiratory: CTAB  Abdomen: Soft, nondistended, + bowel sounds, Right CVAT   Ext: no cyanosis clubbing or edema  Neuro: no new deficits  Skin: No rashes  Psych: Normal affect and demeanor, alert and oriented x3    Data Review   Micro Results Recent Results (from the past 240 hour(s))  Culture, Group A Strep     Status: None   Collection Time: 04/22/15  4:27 PM  Result Value Ref Range Status   Strep A Culture Negative  Final    Comment: (NOTE) Performed At: [facility] [address] [place], [place] [id number] Mila Homer MD JY:[id number]   Blood Culture (routine x 2)     Status: None (Preliminary result)   Collection Time: 04/24/15 11:55 AM  Result Value Ref Range Status   Specimen Description BLOOD RIGHT ANTECUBITAL  Final   Special Requests BOTTLES DRAWN AEROBIC AND ANAEROBIC 5CC EACH  Final   Culture  Setup Time   Final    GRAM NEGATIVE RODS AEROBIC BOTTLE ONLY CRITICAL RESULT CALLED TO, READ BACK BY AND VERIFIED WITH: Doreene Adas RN [id number] 04/25/15 A BROWNING    Culture   Final    ESCHERICHIA COLI Performed at [facility]    Report Status PENDING  Incomplete  Urine culture     Status: None   Collection Time: 04/24/15 12:40 PM  Result Value Ref Range Status   Specimen Description URINE, RANDOM  Final   Special Requests NONE  Final   Culture   Final    >=100,000 COLONIES/mL ESCHERICHIA COLI Performed at [facility]     Report Status 04/26/2015 FINAL  Final   Organism ID, Bacteria ESCHERICHIA COLI  Final      Susceptibility   Escherichia coli - MIC*    AMPICILLIN <=  2 SENSITIVE Sensitive     CEFAZOLIN <=4 SENSITIVE Sensitive     CEFTRIAXONE <=1 SENSITIVE Sensitive     CIPROFLOXACIN <=0.25 SENSITIVE Sensitive     GENTAMICIN <=1 SENSITIVE Sensitive     IMIPENEM <=0.25 SENSITIVE Sensitive     NITROFURANTOIN <=16 SENSITIVE Sensitive     TRIMETH/SULFA <=20 SENSITIVE Sensitive     AMPICILLIN/SULBACTAM <=2 SENSITIVE Sensitive     PIP/TAZO <=4 SENSITIVE Sensitive     * >=100,000 COLONIES/mL ESCHERICHIA COLI  Blood Culture (routine x 2)     Status: None (Preliminary result)   Collection Time: 04/24/15 12:45 PM  Result Value Ref Range Status   Specimen Description BLOOD LEFT ANTECUBITAL  Final   Special Requests BOTTLES DRAWN AEROBIC AND ANAEROBIC 5CC EACH  Final   Culture   Final    NO GROWTH < 24 HOURS Performed at [facility]    Report Status PENDING  Incomplete    Radiology Reports Dg Chest 2 View  04/24/2015  CLINICAL DATA:  Fever and tachycardia EXAM: CHEST  2 VIEW COMPARISON:  None. FINDINGS: Normal heart size and mediastinal contours. No acute infiltrate or edema. No effusion or pneumothorax. No acute osseous findings. IMPRESSION: Negative chest. Electronically Signed   By: Marnee Spring M.D.   On: 04/24/2015 14:19   Ct Abdomen Pelvis W Contrast  04/24/2015  CLINICAL DATA:  Acute right lower quadrant abdominal pain, fever, nausea. EXAM: CT ABDOMEN AND PELVIS WITH CONTRAST TECHNIQUE: Multidetector CT imaging of the abdomen and pelvis was performed using the standard protocol following bolus administration of intravenous contrast. CONTRAST:  OMNIPAQUE IOHEXOL 300 MG/ML SOLN, 25mL OMNIPAQUE IOHEXOL 300 MG/ML SOLN COMPARISON:  None. FINDINGS: Visualized lung bases appear normal. No significant osseous abnormality is noted. No gallstones are noted. The liver, spleen and pancreas appear  normal. Adrenal glands appear normal. Left kidney appears normal. No hydronephrosis or renal obstruction is noted. No renal or ureteral calculi are noted. The right kidney demonstrates hazy contours with minimal surrounding fluid most consistent with pyelonephritis. The appendix appears normal. There is no evidence of bowel obstruction. No abnormal fluid collection is noted. Uterus and ovaries appear normal. Urinary bladder appears normal. No significant adenopathy is noted. IMPRESSION: Findings most consistent with right-sided pyelonephritis. No hydronephrosis or renal obstruction is noted. No renal or ureteral calculi are noted. Electronically Signed   By: Lupita Raider, M.D.   On: 04/24/2015 14:25    CBC  Recent Labs Lab 04/24/15 1155 04/25/15 0457  WBC 7.9 7.3  HGB 12.5 10.5*  HCT 36.9 31.1*  PLT 196 147*  MCV 82.0 83.4  MCH 27.8 28.2  MCHC 33.9 33.8  RDW 16.4* 16.6*  LYMPHSABS 0.7  --   MONOABS 0.6  --   EOSABS 0.0  --   BASOSABS 0.0  --     Chemistries   Recent Labs Lab 04/24/15 1155 04/24/15 2030 04/24/15 2305 04/25/15 0457  NA 137 141  --  140  K 2.7* 3.6  --  3.4*  CL 103 113*  --  111  CO2 22 19*  --  21*  GLUCOSE 163* 151*  --  115*  BUN 6 <5*  --  <5*  CREATININE 0.67 0.59  --  0.53  CALCIUM 8.7* 7.9*  --  7.5*  MG  --   --  1.6*  --    ------------------------------------------------------------------------------------------------------------------ estimated creatinine clearance is 101.1 mL/min (by C-G formula based on Cr of 0.53). ------------------------------------------------------------------------------------------------------------------ No results for input(s): HGBA1C  in the last 72 hours. ------------------------------------------------------------------------------------------------------------------ No results for input(s): CHOL, HDL, LDLCALC, TRIG, CHOLHDL, LDLDIRECT in the last 72  hours. ------------------------------------------------------------------------------------------------------------------ No results for input(s): TSH, T4TOTAL, T3FREE, THYROIDAB in the last 72 hours.  Invalid input(s): FREET3 ------------------------------------------------------------------------------------------------------------------ No results for input(s): VITAMINB12, FOLATE, FERRITIN, TIBC, IRON, RETICCTPCT in the last 72 hours.  Coagulation profile No results for input(s): INR, PROTIME in the last 168 hours.  No results for input(s): DDIMER in the last 72 hours.  Cardiac Enzymes No results for input(s): CKMB, TROPONINI, MYOGLOBIN in the last 168 hours.  Invalid input(s): CK ------------------------------------------------------------------------------------------------------------------ Invalid input(s): POCBNP  No results for input(s): GLUCAP in the last 72 hours.   RAI,RIPUDEEP M.D. [facility] 04/26/2015, 1:30 PM  Pager: [phone] Between 7am to 7pm - call Pager - [phone]  After 7pm go to [email or web address] - password [email or web address]  Call night coverage person covering after 7pm

## 2015-04-27 LAB — CULTURE, BLOOD (ROUTINE X 2)

## 2015-04-27 LAB — CBC
HCT: 30.2 % — ABNORMAL LOW (ref 36.0–46.0)
Hemoglobin: 9.9 g/dL — ABNORMAL LOW (ref 12.0–15.0)
MCH: 27.3 pg (ref 26.0–34.0)
MCHC: 32.8 g/dL (ref 30.0–36.0)
MCV: 83.2 fL (ref 78.0–100.0)
PLATELETS: 205 10*3/uL (ref 150–400)
RBC: 3.63 MIL/uL — AB (ref 3.87–5.11)
RDW: 16.5 % — AB (ref 11.5–15.5)
WBC: 4.7 10*3/uL (ref 4.0–10.5)

## 2015-04-27 LAB — BASIC METABOLIC PANEL
Anion gap: 7 (ref 5–15)
BUN: <5 mg/dL — ABNORMAL LOW (ref 6–20)
CALCIUM: 8.1 mg/dL — AB (ref 8.9–10.3)
CO2: 27 mmol/L (ref 22–32)
CREATININE: 0.54 mg/dL (ref 0.44–1.00)
Chloride: 108 mmol/L (ref 101–111)
GFR calc non Af Amer: >60 mL/min (ref >60)
Glucose, Bld: 97 mg/dL (ref 65–99)
Potassium: 3.5 mmol/L (ref 3.5–5.1)
SODIUM: 142 mmol/L (ref 135–145)

## 2015-04-27 MED ORDER — OXYCODONE-ACETAMINOPHEN 5-325 MG PO TABS: 1.0000 | ORAL_TABLET | Freq: Three times a day (TID) | ORAL | Status: DC | PRN

## 2015-04-27 MED ORDER — PROMETHAZINE HCL 12.5 MG PO TABS: 12.5000 mg | ORAL_TABLET | Freq: Four times a day (QID) | ORAL | Status: DC | PRN

## 2015-04-27 MED ORDER — CIPROFLOXACIN HCL 500 MG PO TABS
500.0000 mg | ORAL_TABLET | Freq: Two times a day (BID) | ORAL | Status: DC
Start: 2015-04-27 — End: 2015-04-27

## 2015-04-27 MED ORDER — CEFUROXIME AXETIL 500 MG PO TABS
500.0000 mg | ORAL_TABLET | Freq: Two times a day (BID) | ORAL | Status: DC
  Administered 2015-04-27: 500 mg via ORAL
  Filled 2015-04-27 (×3): qty 1

## 2015-04-27 MED ORDER — CEFUROXIME AXETIL 500 MG PO TABS: 500.0000 mg | ORAL_TABLET | Freq: Two times a day (BID) | ORAL | Status: DC

## 2015-04-27 MED ORDER — CIPROFLOXACIN HCL 500 MG PO TABS: 500.0000 mg | ORAL_TABLET | Freq: Two times a day (BID) | ORAL | Status: DC

## 2015-04-27 NOTE — Discharge Summary (Signed)
Physician Discharge Summary   Patient ID: [name] MRN: [id number] DOB/AGE: [birth year] 29 y.o.  Admit date: 04/24/2015 Discharge date: 04/27/2015  Primary Care Physician:  Rudi Heap, MD  Discharge Diagnoses:    . Escherichia coli sepsis/bacteremia  . Severe sepsis (HCC) secondary to Escherichia coli UTI, pyelonephritis  . Fever presenting with conditions classified elsewhere . Hypokalemia . Nausea vomiting and diarrhea resolved  . Hyperglycemia . Tachycardia  Consults: None  Recommendations for Outpatient Follow-up:  1. Patient was discharged on Ceftin 500 mg BID x 2 weeks    DIET: Regular    Allergies:   Allergies  Allergen Reactions  . Dilaudid [Hydromorphone Hcl] Itching     DISCHARGE MEDICATIONS: Current Discharge Medication List    START taking these medications   Details  cefUROXime (CEFTIN) 500 MG tablet Take 1 tablet (500 mg total) by mouth 2 (two) times daily with a meal. X 2 weeks Qty: 28 tablet, Refills: 0    promethazine (PHENERGAN) 12.5 MG tablet Take 1 tablet (12.5 mg total) by mouth every 6 (six) hours as needed for nausea or vomiting. Qty: 30 tablet, Refills: 0      CONTINUE these medications which have CHANGED   Details  oxyCODONE-acetaminophen (ROXICET) 5-325 MG tablet Take 1 tablet by mouth every 8 (eight) hours as needed for severe pain. Qty: 30 tablet, Refills: 0      CONTINUE these medications which have NOT CHANGED   Details  levonorgestrel-ethinyl estradiol (PORTIA-28) 0.15-30 MG-MCG tablet Take 1 tablet by mouth daily.    FLUoxetine (PROZAC) 20 MG tablet Take 20 mg by mouth daily.    ibuprofen (ADVIL,MOTRIN) 600 MG tablet Take 1 tablet (600 mg total) by mouth every 6 (six) hours as needed. Qty: 90 tablet, Refills: 0         Brief H and P: For complete details please refer to admission H and P, but in brief [name] is a 29 year old female 2 months postpartum from a C-section; who presented initially to the ED  with a six-day history of fever and right-sided abdominal pain. Symptoms initially started with fever and chills, high as 103F, also associated with nausea, vomiting and diarrhea..In [facility] [place] she was seen to have HR 130's, fever 102.35F, and lactic acid level of 3.48. CT of the abdomen showed findings consistent with a right pyelonephritis without hydronephrosis or renal obstruction. IV Rocephin was started and the patient was transferred to [facility].  Hospital Course:  Severe sepsis (HCC) secondary to Escherichia coli UTI, pyelonephritis, right-sided, gram-negative bacteremia -  Urine culture showed Escherichia coli, patient was placed on IV Rocephin during hospitalization.  - Blood cultures also showed Escherichia coli. Patient was transitioned to oral antibiotics, Ceftin for 2 weeks.  - Flank pain has significantly improved.   Hypokalemia -Potassium 3.5 at the time of discharge, was replaced during hospitalization   Nausea vomiting and diarrhea - Resolved, patient tolerating diet without any difficulty   Day of Discharge BP 139/91 mmHg  Pulse 65  Temp(Src) 98 F (36.7 C) (Oral)  Resp 16  Ht  (1.651 m)  Wt 68.765 kg (151 lb 9.6 oz)  BMI 25.23 kg/m2  SpO2 95%  LMP 04/20/2015  Physical Exam: General: Alert and awake oriented x3 not in any acute distress. HEENT: anicteric sclera, pupils reactive to light and accommodation CVS: S1-S2 clear no murmur rubs or gallops Chest: clear to auscultation bilaterally, no wheezing rales or rhonchi Abdomen: soft nontender, nondistended, normal bowel sounds Extremities: no cyanosis,  clubbing or edema noted bilaterally Neuro: Cranial nerves II-XII intact, no focal neurological deficits   The results of significant diagnostics from this hospitalization (including imaging, microbiology, ancillary and laboratory) are listed below for reference.    LAB RESULTS: Basic Metabolic Panel:  Recent Labs Lab 04/24/15 2305  04/25/15 0457 04/27/15 0550  NA  --  140 142  K  --  3.4* 3.5  CL  --  111 108  CO2  --  21* 27  GLUCOSE  --  115* 97  BUN  --  <5* <5*  CREATININE  --  0.53 0.54  CALCIUM  --  7.5* 8.1*  MG 1.6*  --   --    Liver Function Tests: No results for input(s): AST, ALT, ALKPHOS, BILITOT, PROT, ALBUMIN in the last 168 hours. No results for input(s): LIPASE, AMYLASE in the last 168 hours. No results for input(s): AMMONIA in the last 168 hours. CBC:  Recent Labs Lab 04/24/15 1155 04/25/15 0457 04/27/15 0550  WBC 7.9 7.3 4.7  NEUTROABS 6.6  --   --   HGB 12.5 10.5* 9.9*  HCT 36.9 31.1* 30.2*  MCV 82.0 83.4 83.2  PLT 196 147* 205   Cardiac Enzymes: No results for input(s): CKTOTAL, CKMB, CKMBINDEX, TROPONINI in the last 168 hours. BNP: Invalid input(s): POCBNP CBG: No results for input(s): GLUCAP in the last 168 hours.  Significant Diagnostic Studies:  Dg Chest 2 View  04/24/2015  CLINICAL DATA:  Fever and tachycardia EXAM: CHEST  2 VIEW COMPARISON:  None. FINDINGS: Normal heart size and mediastinal contours. No acute infiltrate or edema. No effusion or pneumothorax. No acute osseous findings. IMPRESSION: Negative chest. Electronically Signed   By: Marnee Spring M.D.   On: 04/24/2015 14:19   Ct Abdomen Pelvis W Contrast  04/24/2015  CLINICAL DATA:  Acute right lower quadrant abdominal pain, fever, nausea. EXAM: CT ABDOMEN AND PELVIS WITH CONTRAST TECHNIQUE: Multidetector CT imaging of the abdomen and pelvis was performed using the standard protocol following bolus administration of intravenous contrast. CONTRAST:  OMNIPAQUE IOHEXOL 300 MG/ML SOLN, 25mL OMNIPAQUE IOHEXOL 300 MG/ML SOLN COMPARISON:  None. FINDINGS: Visualized lung bases appear normal. No significant osseous abnormality is noted. No gallstones are noted. The liver, spleen and pancreas appear normal. Adrenal glands appear normal. Left kidney appears normal. No hydronephrosis or renal obstruction is noted. No renal  or ureteral calculi are noted. The right kidney demonstrates hazy contours with minimal surrounding fluid most consistent with pyelonephritis. The appendix appears normal. There is no evidence of bowel obstruction. No abnormal fluid collection is noted. Uterus and ovaries appear normal. Urinary bladder appears normal. No significant adenopathy is noted. IMPRESSION: Findings most consistent with right-sided pyelonephritis. No hydronephrosis or renal obstruction is noted. No renal or ureteral calculi are noted. Electronically Signed   By: Lupita Raider, M.D.   On: 04/24/2015 14:25    2D ECHO:   Disposition and Follow-up: Discharge Instructions    Diet general    Complete by:  As directed      Increase activity slowly    Complete by:  As directed             DISPOSITION: HOME    DISCHARGE FOLLOW-UP Follow-up Information    Follow up with Rudi Heap, MD In 2 weeks.   Specialty:  Family Medicine   Why:  for hospital follow-up/// Appointment is with Dr. Molli Barrows 05/11/15 at 12:55pm   Contact information:   [address] [place]  [phone]        Time spent on Discharge: 25MINS   Signed:   Laelah Siravo M.D. [facility] 04/27/2015, 11:26 AM Pager: [phone]

## 2015-04-29 LAB — CULTURE, BLOOD (ROUTINE X 2): Culture: NO GROWTH

## 2015-05-01 LAB — CULTURE, BLOOD (ROUTINE X 2)
CULTURE: NO GROWTH
Culture: NO GROWTH

## 2015-05-10 ENCOUNTER — Ambulatory Visit (INDEPENDENT_AMBULATORY_CARE_PROVIDER_SITE_OTHER): Payer: 59 | Admitting: Family Medicine

## 2015-05-10 ENCOUNTER — Encounter: Payer: Self-pay | Admitting: Family Medicine

## 2015-05-10 VITALS — BP 134/88 | HR 88 | Temp 97.4°F | Ht 65.0 in | Wt 151.2 lb

## 2015-05-10 DIAGNOSIS — A4151 Sepsis due to Escherichia coli [E. coli]: Secondary | ICD-10-CM | POA: Diagnosis not present

## 2015-05-10 DIAGNOSIS — N10 Acute tubulo-interstitial nephritis: Secondary | ICD-10-CM

## 2015-05-10 NOTE — Progress Notes (Signed)
BP 134/88 mmHg  Pulse 88  Temp(Src) 97.4 F (36.3 C) (Oral)  Ht 5\' 5"  (1.651 m)  Wt 151 lb 3.2 oz (68.584 kg)  BMI 25.16 kg/m2  LMP 04/20/2015   Subjective:    Patient ID: Amy Jackson, female    DOB: 1986-01-16, 29 y.o.   MRN: [id number]  HPI: Amy Jackson is a 29 y.o. female presenting on 05/10/2015 for Hospital followup   HPI Hospital Follow-up Patient coming in to [place] after being in the hospital for pyelonephritis and sepsis. All of her symptoms have resolved and she is no longer having any abdominal pain or dysuria or fevers or hypotension. She still currently on antibiotics and has 3 more days left.  Relevant past medical, surgical, family and social history reviewed and updated as indicated. Interim medical history since our last visit reviewed. Allergies and medications reviewed and updated.  Review of Systems  Constitutional: Negative for fever and chills.  HENT: Negative for congestion, ear discharge and ear pain.   Eyes: Negative for redness and visual disturbance.  Respiratory: Negative for chest tightness and shortness of breath.   Cardiovascular: Negative for chest pain and leg swelling.  Genitourinary: Negative for dysuria, urgency, frequency, hematuria, flank pain, difficulty urinating and pelvic pain.  Musculoskeletal: Negative for back pain and gait problem.  Skin: Negative for color change and rash.  Neurological: Negative for dizziness, light-headedness and headaches.  Psychiatric/Behavioral: Negative for behavioral problems and agitation.  All other systems reviewed and are negative.   Per HPI unless specifically indicated above     Medication List       This list is accurate as of: 05/10/15  1:35 PM.  Always use your most recent med list.               cefUROXime 500 MG tablet  Commonly known as:  CEFTIN  Take 1 tablet (500 mg total) by mouth 2 (two) times daily with a meal. X 2 weeks     FLUoxetine 20 MG tablet  Commonly known as:   PROZAC  Take 20 mg by mouth daily.     ibuprofen 600 MG tablet  Commonly known as:  ADVIL,MOTRIN  Take 1 tablet (600 mg total) by mouth every 6 (six) hours as needed.     oxyCODONE-acetaminophen 5-325 MG tablet  Commonly known as:  ROXICET  Take 1 tablet by mouth every 8 (eight) hours as needed for severe pain.     PORTIA-28 0.15-30 MG-MCG tablet  Generic drug:  levonorgestrel-ethinyl estradiol  Take 1 tablet by mouth daily. Reported on 05/10/2015     promethazine 12.5 MG tablet  Commonly known as:  PHENERGAN  Take 1 tablet (12.5 mg total) by mouth every 6 (six) hours as needed for nausea or vomiting.           Objective:    BP 134/88 mmHg  Pulse 88  Temp(Src) 97.4 F (36.3 C) (Oral)  Ht 5\' 5"  (1.651 m)  Wt 151 lb 3.2 oz (68.584 kg)  BMI 25.16 kg/m2  LMP 04/20/2015  Wt Readings from Last 3 Encounters:  05/10/15 151 lb 3.2 oz (68.584 kg)  04/24/15 151 lb 9.6 oz (68.765 kg)  02/14/15 177 lb (80.287 kg)    Physical Exam  Constitutional: She is oriented to person, place, and time. She appears well-developed and well-nourished. No distress.  Eyes: Conjunctivae and EOM are normal. Pupils are equal, round, and reactive to light.  Cardiovascular: Normal rate, regular rhythm, normal heart sounds  and intact distal pulses.   No murmur heard. Pulmonary/Chest: Effort normal and breath sounds normal. No respiratory distress. She has no wheezes.  Abdominal: Soft. Bowel sounds are normal. She exhibits no distension and no mass. There is no tenderness (No CVA tenderness). There is no rebound and no guarding.  Musculoskeletal: Normal range of motion. She exhibits no edema or tenderness.  Neurological: She is alert and oriented to person, place, and time. Coordination normal.  Skin: Skin is warm and dry. No rash noted. She is not diaphoretic.  Psychiatric: She has a normal mood and affect. Her behavior is normal.  Nursing note and vitals reviewed.   Results for orders placed or  performed during the hospital encounter of 04/24/15  Blood Culture (routine x 2)  Result Value Ref Range   Specimen Description BLOOD LEFT ANTECUBITAL    Special Requests BOTTLES DRAWN AEROBIC AND ANAEROBIC 5CC EACH    Culture      NO GROWTH 5 DAYS Performed at [facility]    Report Status 04/29/2015 FINAL   Blood Culture (routine x 2)  Result Value Ref Range   Specimen Description BLOOD RIGHT ANTECUBITAL    Special Requests BOTTLES DRAWN AEROBIC AND ANAEROBIC 5CC EACH    Culture  Setup Time      GRAM NEGATIVE RODS AEROBIC BOTTLE ONLY CRITICAL RESULT CALLED TO, READ BACK BY AND VERIFIED WITH: Doreene Adas RN [phone] 04/25/15 A BROWNING    Culture      ESCHERICHIA COLI Performed at [facility]    Report Status 04/27/2015 FINAL    Organism ID, Bacteria ESCHERICHIA COLI       Susceptibility   Escherichia coli - MIC*    AMPICILLIN <=2 SENSITIVE Sensitive     CEFAZOLIN <=4 SENSITIVE Sensitive     CEFEPIME <=1 SENSITIVE Sensitive     CEFTAZIDIME <=1 SENSITIVE Sensitive     CEFTRIAXONE <=1 SENSITIVE Sensitive     CIPROFLOXACIN <=0.25 SENSITIVE Sensitive     GENTAMICIN <=1 SENSITIVE Sensitive     IMIPENEM <=0.25 SENSITIVE Sensitive     TRIMETH/SULFA <=20 SENSITIVE Sensitive     AMPICILLIN/SULBACTAM <=2 SENSITIVE Sensitive     PIP/TAZO <=4 SENSITIVE Sensitive     * ESCHERICHIA COLI  Urine culture  Result Value Ref Range   Specimen Description URINE, RANDOM    Special Requests NONE    Culture      >=100,000 COLONIES/mL ESCHERICHIA COLI Performed at [facility]    Report Status 04/26/2015 FINAL    Organism ID, Bacteria ESCHERICHIA COLI       Susceptibility   Escherichia coli - MIC*    AMPICILLIN <=2 SENSITIVE Sensitive     CEFAZOLIN <=4 SENSITIVE Sensitive     CEFTRIAXONE <=1 SENSITIVE Sensitive     CIPROFLOXACIN <=0.25 SENSITIVE Sensitive     GENTAMICIN <=1 SENSITIVE Sensitive     IMIPENEM <=0.25 SENSITIVE Sensitive     NITROFURANTOIN <=16 SENSITIVE  Sensitive     TRIMETH/SULFA <=20 SENSITIVE Sensitive     AMPICILLIN/SULBACTAM <=2 SENSITIVE Sensitive     PIP/TAZO <=4 SENSITIVE Sensitive     * >=100,000 COLONIES/mL ESCHERICHIA COLI  Culture, blood (routine x 2)  Result Value Ref Range   Specimen Description BLOOD RIGHT HAND    Special Requests BOTTLES DRAWN AEROBIC ONLY 10CC    Culture NO GROWTH 5 DAYS    Report Status 05/01/2015 FINAL   Culture, blood (routine x 2)  Result Value Ref Range   Specimen Description BLOOD RIGHT  HAND    Special Requests IN PEDIATRIC BOTTLE 5CC    Culture NO GROWTH 5 DAYS    Report Status 05/01/2015 FINAL   CBC with Differential  Result Value Ref Range   WBC 7.9 4.0 - 10.5 K/uL   RBC 4.50 3.87 - 5.11 MIL/uL   Hemoglobin 12.5 12.0 - 15.0 g/dL   HCT 60.636.9 30.136.0 - 60.146.0 %   MCV 82.0 78.0 - 100.0 fL   MCH 27.8 26.0 - 34.0 pg   MCHC 33.9 30.0 - 36.0 g/dL   RDW 09.316.4 (H) 23.511.5 - 57.315.5 %   Platelets 196 150 - 400 K/uL   Neutrophils Relative % 83 %   Neutro Abs 6.6 1.7 - 7.7 K/uL   Lymphocytes Relative 9 %   Lymphs Abs 0.7 0.7 - 4.0 K/uL   Monocytes Relative 8 %   Monocytes Absolute 0.6 0.1 - 1.0 K/uL   Eosinophils Relative 0 %   Eosinophils Absolute 0.0 0.0 - 0.7 K/uL   Basophils Relative 0 %   Basophils Absolute 0.0 0.0 - 0.1 K/uL  Basic metabolic panel  Result Value Ref Range   Sodium 137 135 - 145 mmol/L   Potassium 2.7 (LL) 3.5 - 5.1 mmol/L   Chloride 103 101 - 111 mmol/L   CO2 22 22 - 32 mmol/L   Glucose, Bld 163 (H) 65 - 99 mg/dL   BUN 6 6 - 20 mg/dL   Creatinine, Ser 2.200.67 0.44 - 1.00 mg/dL   Calcium 8.7 (L) 8.9 - 10.3 mg/dL   GFR calc non Af Amer >60 >60 mL/min   GFR calc Af Amer >60 >60 mL/min   Anion gap 12 5 - 15  Urinalysis, Routine w reflex microscopic (not at [facility])  Result Value Ref Range   Color, Urine YELLOW YELLOW   APPearance CLOUDY (A) CLEAR   Specific Gravity, Urine 1.017 1.005 - 1.030   pH 6.0 5.0 - 8.0   Glucose, UA 250 (A) NEGATIVE mg/dL   Hgb urine dipstick LARGE (A)  NEGATIVE   Bilirubin Urine NEGATIVE NEGATIVE   Ketones, ur NEGATIVE NEGATIVE mg/dL   Protein, ur 254100 (A) NEGATIVE mg/dL   Nitrite POSITIVE (A) NEGATIVE   Leukocytes, UA SMALL (A) NEGATIVE  Pregnancy, urine  Result Value Ref Range   Preg Test, Ur NEGATIVE NEGATIVE  Influenza panel by PCR (type A & B, H1N1)  Result Value Ref Range   Influenza A By PCR NEGATIVE NEGATIVE   Influenza B By PCR NEGATIVE NEGATIVE   H1N1 flu by pcr NOT DETECTED NOT DETECTED  Urine microscopic-add on  Result Value Ref Range   Squamous Epithelial / LPF 0-5 (A) NONE SEEN   WBC, UA 6-30 0 - 5 WBC/hpf   RBC / HPF 6-30 0 - 5 RBC/hpf   Bacteria, UA MANY (A) NONE SEEN   Urine-Other MUCOUS PRESENT   Basic metabolic panel  Result Value Ref Range   Sodium 140 135 - 145 mmol/L   Potassium 3.4 (L) 3.5 - 5.1 mmol/L   Chloride 111 101 - 111 mmol/L   CO2 21 (L) 22 - 32 mmol/L   Glucose, Bld 115 (H) 65 - 99 mg/dL   BUN <5 (L) 6 - 20 mg/dL   Creatinine, Ser 2.700.53 0.44 - 1.00 mg/dL   Calcium 7.5 (L) 8.9 - 10.3 mg/dL   GFR calc non Af Amer >60 >60 mL/min   GFR calc Af Amer >60 >60 mL/min   Anion gap 8 5 - 15  CBC  Result Value Ref Range   WBC 7.3 4.0 - 10.5 K/uL   RBC 3.73 (L) 3.87 - 5.11 MIL/uL   Hemoglobin 10.5 (L) 12.0 - 15.0 g/dL   HCT 16.1 (L) 09.6 - 04.5 %   MCV 83.4 78.0 - 100.0 fL   MCH 28.2 26.0 - 34.0 pg   MCHC 33.8 30.0 - 36.0 g/dL   RDW 40.9 (H) 81.1 - 91.4 %   Platelets 147 (L) 150 - 400 K/uL  Basic metabolic panel  Result Value Ref Range   Sodium 141 135 - 145 mmol/L   Potassium 3.6 3.5 - 5.1 mmol/L   Chloride 113 (H) 101 - 111 mmol/L   CO2 19 (L) 22 - 32 mmol/L   Glucose, Bld 151 (H) 65 - 99 mg/dL   BUN <5 (L) 6 - 20 mg/dL   Creatinine, Ser 7.82 0.44 - 1.00 mg/dL   Calcium 7.9 (L) 8.9 - 10.3 mg/dL   GFR calc non Af Amer >60 >60 mL/min   GFR calc Af Amer >60 >60 mL/min   Anion gap 9 5 - 15  Magnesium  Result Value Ref Range   Magnesium 1.6 (L) 1.7 - 2.4 mg/dL  Calcium, ionized  Result  Value Ref Range   Calcium, Ionized, Serum 4.6 4.5 - 5.6 mg/dL  Lactic acid, plasma  Result Value Ref Range   Lactic Acid, Venous 0.9 0.5 - 2.0 mmol/L  Lactic acid, plasma  Result Value Ref Range   Lactic Acid, Venous 1.9 0.5 - 2.0 mmol/L  Basic metabolic panel  Result Value Ref Range   Sodium 142 135 - 145 mmol/L   Potassium 3.5 3.5 - 5.1 mmol/L   Chloride 108 101 - 111 mmol/L   CO2 27 22 - 32 mmol/L   Glucose, Bld 97 65 - 99 mg/dL   BUN <5 (L) 6 - 20 mg/dL   Creatinine, Ser 9.56 0.44 - 1.00 mg/dL   Calcium 8.1 (L) 8.9 - 10.3 mg/dL   GFR calc non Af Amer >60 >60 mL/min   GFR calc Af Amer >60 >60 mL/min   Anion gap 7 5 - 15  CBC  Result Value Ref Range   WBC 4.7 4.0 - 10.5 K/uL   RBC 3.63 (L) 3.87 - 5.11 MIL/uL   Hemoglobin 9.9 (L) 12.0 - 15.0 g/dL   HCT 21.3 (L) 08.6 - 57.8 %   MCV 83.2 78.0 - 100.0 fL   MCH 27.3 26.0 - 34.0 pg   MCHC 32.8 30.0 - 36.0 g/dL   RDW 46.9 (H) 62.9 - 52.8 %   Platelets 205 150 - 400 K/uL  I-Stat CG4 Lactic Acid, ED  Result Value Ref Range   Lactic Acid, Venous 3.28 (HH) 0.5 - 2.0 mmol/L   Comment NOTIFIED PHYSICIAN   I-Stat CG4 Lactic Acid, ED  Result Value Ref Range   Lactic Acid, Venous 0.41 (L) 0.5 - 2.0 mmol/L      Assessment & Plan:   Problem List Items Addressed This Visit    None    Visit Diagnoses    Sepsis due to Escherichia coli [facility])    -  Primary    Acute pyelonephritis        Patient come in for hospital folow-up for sepsis and acute pyelonephrits, still on antibiotics, symptoms are resolved        Follow up plan: Return in about 2 weeks (around 05/24/2015), or if symptoms worsen or fail to improve, for f/u uti.  Counseling provided for all  of the vaccine components No orders of the defined types were placed in this encounter.    Arville Care, MD [facility] 05/10/2015, 1:35 PM

## 2015-05-11 ENCOUNTER — Ambulatory Visit: Payer: 59 | Admitting: Family Medicine

## 2015-05-24 ENCOUNTER — Encounter: Payer: Self-pay | Admitting: Family Medicine

## 2015-05-24 ENCOUNTER — Ambulatory Visit (INDEPENDENT_AMBULATORY_CARE_PROVIDER_SITE_OTHER): Payer: 59 | Admitting: Family Medicine

## 2015-05-24 VITALS — BP 124/84 | HR 96 | Temp 97.8°F | Ht 65.0 in | Wt 154.4 lb

## 2015-05-24 DIAGNOSIS — A419 Sepsis, unspecified organism: Secondary | ICD-10-CM

## 2015-05-24 DIAGNOSIS — R319 Hematuria, unspecified: Secondary | ICD-10-CM | POA: Diagnosis not present

## 2015-05-24 DIAGNOSIS — R6520 Severe sepsis without septic shock: Secondary | ICD-10-CM

## 2015-05-24 LAB — POCT URINALYSIS DIPSTICK
Bilirubin, UA: NEGATIVE
Glucose, UA: NEGATIVE
KETONES UA: NEGATIVE
NITRITE UA: NEGATIVE
PH UA: 5
PROTEIN UA: NEGATIVE
Spec Grav, UA: 1.01
Urobilinogen, UA: NEGATIVE

## 2015-05-24 LAB — POCT UA - MICROSCOPIC ONLY
Casts, Ur, LPF, POC: NEGATIVE
Crystals, Ur, HPF, POC: NEGATIVE
Mucus, UA: NEGATIVE
YEAST UA: NEGATIVE

## 2015-05-24 NOTE — Progress Notes (Signed)
BP 124/84 mmHg  Pulse 96  Temp(Src) 97.8 F (36.6 C) (Oral)  Ht 5\' 5"  (1.651 m)  Wt 154 lb 6.4 oz (70.035 kg)  BMI 25.69 kg/m2   Subjective:    Patient ID: Amy Jackson, female    DOB: [birth year], 30 y.o.   MRN: [id number]  HPI: Amy Jackson is a 30 y.o. female presenting on 05/24/2015 for Followup sepsis, recheck urine   HPI Follow-up urinary tract infection and sepsis Patient is coming in today for a recheck on her urine because of her sepsis and urinary tract infection that she had that her in the hospital. She denies any symptoms of dysuria or flank pain or abdominal pain or visible hematuria or nausea or vomiting or fevers or chills. Her child has been doing well since delivery.  Relevant past medical, surgical, family and social history reviewed and updated as indicated. Interim medical history since our last visit reviewed. Allergies and medications reviewed and updated.  Review of Systems  Constitutional: Negative for fever and chills.  HENT: Negative for congestion, ear discharge and ear pain.   Eyes: Negative for redness and visual disturbance.  Respiratory: Negative for chest tightness and shortness of breath.   Cardiovascular: Negative for chest pain and leg swelling.  Genitourinary: Negative for dysuria, urgency, frequency, flank pain, decreased urine volume and difficulty urinating.  Musculoskeletal: Negative for back pain and gait problem.  Skin: Negative for rash.  Neurological: Negative for light-headedness and headaches.  Psychiatric/Behavioral: Negative for behavioral problems and agitation.  All other systems reviewed and are negative.   Per HPI unless specifically indicated above     Medication List       This list is accurate as of: 05/24/15 10:29 AM.  Always use your most recent med list.               FLUoxetine 20 MG tablet  Commonly known as:  PROZAC  Take 20 mg by mouth daily.     PORTIA-28 0.15-30 MG-MCG tablet  Generic drug:   levonorgestrel-ethinyl estradiol  Take 1 tablet by mouth daily. Reported on 05/10/2015           Objective:    BP 124/84 mmHg  Pulse 96  Temp(Src) 97.8 F (36.6 C) (Oral)  Ht 5\' 5"  (1.651 m)  Wt 154 lb 6.4 oz (70.035 kg)  BMI 25.69 kg/m2  Wt Readings from Last 3 Encounters:  05/24/15 154 lb 6.4 oz (70.035 kg)  05/10/15 151 lb 3.2 oz (68.584 kg)  04/24/15 151 lb 9.6 oz (68.765 kg)    Physical Exam  Constitutional: She is oriented to person, place, and time. She appears well-developed and well-nourished. No distress.  Eyes: Conjunctivae and EOM are normal. Pupils are equal, round, and reactive to light.  Neck: Neck supple. No thyromegaly present.  Cardiovascular: Normal rate, regular rhythm, normal heart sounds and intact distal pulses.   No murmur heard. Pulmonary/Chest: Effort normal and breath sounds normal. No respiratory distress. She has no wheezes.  Abdominal: Soft. Bowel sounds are normal. She exhibits no distension. There is no tenderness. There is no rebound.  Musculoskeletal: Normal range of motion. She exhibits no edema or tenderness.  Lymphadenopathy:    She has no cervical adenopathy.  Neurological: She is alert and oriented to person, place, and time. Coordination normal.  Skin: Skin is warm and dry. No rash noted. She is not diaphoretic.  Psychiatric: She has a normal mood and affect. Her behavior is normal.  Nursing note  and vitals reviewed.   Results for orders placed or performed in visit on 05/24/15  POCT UA - Microscopic Only  Result Value Ref Range   WBC, Ur, HPF, POC 5-8    RBC, urine, microscopic occ    Bacteria, U Microscopic few    Mucus, UA negative    Epithelial cells, urine per micros occ    Crystals, Ur, HPF, POC negative    Casts, Ur, LPF, POC negative    Yeast, UA negative   POCT urinalysis dipstick  Result Value Ref Range   Color, UA gold    Clarity, UA clear    Glucose, UA neg    Bilirubin, UA neg    Ketones, UA neg    Spec Grav,  UA 1.010    Blood, UA trace    pH, UA 5.0    Protein, UA negative    Urobilinogen, UA negative    Nitrite, UA negative    Leukocytes, UA Trace (A) Negative      Assessment & Plan:   Problem List Items Addressed This Visit      Other   Severe sepsis (HCC) - Primary   Relevant Orders   POCT UA - Microscopic Only (Completed)   POCT urinalysis dipstick (Completed)    Other Visit Diagnoses    Hematuria        will recheck in 3 months and if positive send to urology.         Follow up plan: Return in about 3 months (around 08/22/2015), or if symptoms worsen or fail to improve, for hematuria.  Counseling provided for all of the vaccine components Orders Placed This Encounter  Procedures  . POCT UA - Microscopic Only  . POCT urinalysis dipstick    Arville Care, MD [facility] 05/24/2015, 10:29 AM

## 2015-06-13 ENCOUNTER — Encounter: Payer: Self-pay | Admitting: Family Medicine

## 2015-06-13 ENCOUNTER — Ambulatory Visit (INDEPENDENT_AMBULATORY_CARE_PROVIDER_SITE_OTHER): Payer: 59 | Admitting: Family Medicine

## 2015-06-13 VITALS — BP 130/75 | HR 91 | Temp 97.2°F | Ht 65.0 in | Wt 152.8 lb

## 2015-06-13 DIAGNOSIS — R5383 Other fatigue: Secondary | ICD-10-CM | POA: Diagnosis not present

## 2015-06-13 DIAGNOSIS — R5381 Other malaise: Secondary | ICD-10-CM

## 2015-06-13 DIAGNOSIS — M545 Low back pain: Secondary | ICD-10-CM | POA: Diagnosis not present

## 2015-06-13 LAB — POCT UA - MICROSCOPIC ONLY
BACTERIA, U MICROSCOPIC: NEGATIVE
Casts, Ur, LPF, POC: NEGATIVE
Crystals, Ur, HPF, POC: NEGATIVE
MUCUS UA: NEGATIVE
RBC, URINE, MICROSCOPIC: NEGATIVE
WBC, Ur, HPF, POC: NEGATIVE

## 2015-06-13 LAB — POCT URINALYSIS DIPSTICK
BILIRUBIN UA: NEGATIVE
Blood, UA: NEGATIVE
Glucose, UA: NEGATIVE
KETONES UA: NEGATIVE
LEUKOCYTES UA: NEGATIVE
Nitrite, UA: NEGATIVE
Protein, UA: NEGATIVE
Spec Grav, UA: >=1.03
Urobilinogen, UA: NEGATIVE
pH, UA: 5

## 2015-06-13 NOTE — Progress Notes (Signed)
BP 130/75 mmHg  Pulse 91  Temp(Src) 97.2 F (36.2 C) (Oral)  Ht 5' 5"  (1.651 m)  Wt 152 lb 12.8 oz (69.31 kg)  BMI 25.43 kg/m2   Subjective:    Patient ID: Amy Jackson, female    DOB: 12-13-1985, 30 y.o.   MRN: [id number]  HPI: Amy Jackson is a 30 y.o. female presenting on 06/13/2015 for Followup sepsis   HPI Low back pain and myalgias Patient presents today because she feels like she is having low back pain and myalgias and headaches and stiffness neck and strong smelling urine and suprapubic tenderness that is similar to what she had before she developed into sepsis and admitted in the hospital for a week. She denies any fevers or chills. She denies any hematuria. She denies any nausea or vomiting. She denies any upper respiratory symptoms are in congestion or wheezing or difficulties breathing. This started up over the past day and a half.  Relevant past medical, surgical, family and social history reviewed and updated as indicated. Interim medical history since our last visit reviewed. Allergies and medications reviewed and updated.  Review of Systems  Constitutional: Negative for fever and chills.  HENT: Negative for congestion, ear discharge and ear pain.   Eyes: Negative for redness and visual disturbance.  Respiratory: Negative for chest tightness and shortness of breath.   Cardiovascular: Negative for chest pain and leg swelling.  Gastrointestinal: Positive for nausea and abdominal pain. Negative for vomiting, diarrhea, constipation, blood in stool and rectal pain.  Genitourinary: Positive for dysuria and frequency. Negative for urgency, hematuria, decreased urine volume and difficulty urinating.  Musculoskeletal: Positive for myalgias, back pain and neck stiffness. Negative for arthralgias and gait problem.  Skin: Negative for rash.  Neurological: Positive for headaches. Negative for dizziness and light-headedness.  Psychiatric/Behavioral: Negative for behavioral  problems and agitation.  All other systems reviewed and are negative.   Per HPI unless specifically indicated above     Medication List       This list is accurate as of: 06/13/15  5:01 PM.  Always use your most recent med list.               FLUoxetine 20 MG tablet  Commonly known as:  PROZAC  Take 20 mg by mouth daily.     PORTIA-28 0.15-30 MG-MCG tablet  Generic drug:  levonorgestrel-ethinyl estradiol  Take 1 tablet by mouth daily. Reported on 05/10/2015           Objective:    BP 130/75 mmHg  Pulse 91  Temp(Src) 97.2 F (36.2 C) (Oral)  Ht 5' 5"  (1.651 m)  Wt 152 lb 12.8 oz (69.31 kg)  BMI 25.43 kg/m2  Wt Readings from Last 3 Encounters:  06/13/15 152 lb 12.8 oz (69.31 kg)  05/24/15 154 lb 6.4 oz (70.035 kg)  05/10/15 151 lb 3.2 oz (68.584 kg)    Physical Exam  Constitutional: She is oriented to person, place, and time. She appears well-developed and well-nourished. No distress.  Eyes: Conjunctivae and EOM are normal. Pupils are equal, round, and reactive to light.  Cardiovascular: Normal rate, regular rhythm, normal heart sounds and intact distal pulses.   No murmur heard. Pulmonary/Chest: Effort normal and breath sounds normal. No respiratory distress. She has no wheezes.  Abdominal: There is tenderness in the suprapubic area. There is no rigidity, no rebound, no guarding and no CVA tenderness.  Musculoskeletal: Normal range of motion. She exhibits no edema.  Lumbar back: She exhibits tenderness (bilateral lower back tendernessIn the paraspinal region. Does not appear to be CVA tenderness because of his lower). She exhibits normal range of motion, no bony tenderness, no deformity and no laceration.  Neurological: She is alert and oriented to person, place, and time. Coordination normal.  Skin: Skin is warm and dry. No rash noted. She is not diaphoretic.  Psychiatric: She has a normal mood and affect. Her behavior is normal.  Nursing note and vitals  reviewed.   Results for orders placed or performed in visit on 06/13/15  POCT UA - Microscopic Only  Result Value Ref Range   WBC, Ur, HPF, POC neg    RBC, urine, microscopic neg    Bacteria, U Microscopic neg    Mucus, UA neg    Epithelial cells, urine per micros occ    Crystals, Ur, HPF, POC neg    Casts, Ur, LPF, POC neg    Yeast, UA occ   POCT urinalysis dipstick  Result Value Ref Range   Color, UA yellow    Clarity, UA clear    Glucose, UA neg    Bilirubin, UA neg    Ketones, UA neg    Spec Grav, UA >=1.030    Blood, UA neg    pH, UA 5.0    Protein, UA neg    Urobilinogen, UA negative    Nitrite, UA neg    Leukocytes, UA Negative Negative      Assessment & Plan:   Problem List Items Addressed This Visit    None    Visit Diagnoses    Low back pain, unspecified back pain laterality, with sciatica presence unspecified    -  Primary    Relevant Orders    POCT UA - Microscopic Only (Completed)    POCT urinalysis dipstick (Completed)    CBC with Differential/Platelet (Completed)    BMP8+EGFR (Completed)    Urine culture    Malaise and fatigue        general achiness, similar to when she had sepsis, u/a neg, will check labs and run culture    Relevant Orders    CBC with Differential/Platelet (Completed)    BMP8+EGFR (Completed)    Urine culture        Follow up plan: Return if symptoms worsen or fail to improve.  Counseling provided for all of the vaccine components Orders Placed This Encounter  Procedures  . POCT UA - Microscopic Only  . POCT urinalysis dipstick    Caryl Pina, MD [facility] 06/13/2015, 5:01 PM

## 2015-06-14 LAB — BMP8+EGFR
BUN / CREAT RATIO: 17 (ref 8–20)
BUN: 11 mg/dL (ref 6–20)
CO2: 23 mmol/L (ref 18–29)
CREATININE: 0.63 mg/dL (ref 0.57–1.00)
Calcium: 9.1 mg/dL (ref 8.7–10.2)
Chloride: 103 mmol/L (ref 96–106)
GFR calc Af Amer: 140 mL/min/{1.73_m2} (ref >59)
GFR, EST NON AFRICAN AMERICAN: 122 mL/min/{1.73_m2} (ref >59)
Glucose: 95 mg/dL (ref 65–99)
Potassium: 4.2 mmol/L (ref 3.5–5.2)
SODIUM: 142 mmol/L (ref 134–144)

## 2015-06-14 LAB — CBC WITH DIFFERENTIAL/PLATELET
BASOS ABS: 0.1 10*3/uL (ref 0.0–0.2)
Basos: 1 %
EOS (ABSOLUTE): 0.1 10*3/uL (ref 0.0–0.4)
Eos: 1 %
HEMATOCRIT: 38.8 % (ref 34.0–46.6)
HEMOGLOBIN: 13.1 g/dL (ref 11.1–15.9)
Immature Grans (Abs): 0 10*3/uL (ref 0.0–0.1)
Immature Granulocytes: 0 %
LYMPHS ABS: 2.4 10*3/uL (ref 0.7–3.1)
Lymphs: 34 %
MCH: 28.7 pg (ref 26.6–33.0)
MCHC: 33.8 g/dL (ref 31.5–35.7)
MCV: 85 fL (ref 79–97)
MONOCYTES: 5 %
MONOS ABS: 0.3 10*3/uL (ref 0.1–0.9)
NEUTROS ABS: 4.1 10*3/uL (ref 1.4–7.0)
Neutrophils: 59 %
Platelets: 224 10*3/uL (ref 150–379)
RBC: 4.56 x10E6/uL (ref 3.77–5.28)
RDW: 14.9 % (ref 12.3–15.4)
WBC: 6.9 10*3/uL (ref 3.4–10.8)

## 2015-06-15 LAB — URINE CULTURE: Organism ID, Bacteria: NO GROWTH

## 2015-06-15 NOTE — Progress Notes (Signed)
PATIENT AWARE

## 2015-08-23 ENCOUNTER — Ambulatory Visit: Payer: 59 | Admitting: Family Medicine

## 2016-03-17 ENCOUNTER — Encounter: Payer: Self-pay | Admitting: Physician Assistant

## 2016-03-17 ENCOUNTER — Ambulatory Visit (INDEPENDENT_AMBULATORY_CARE_PROVIDER_SITE_OTHER): Payer: 59 | Admitting: Physician Assistant

## 2016-03-17 VITALS — BP 130/88 | HR 93 | Temp 97.7°F | Ht 65.0 in | Wt 159.2 lb

## 2016-03-17 DIAGNOSIS — F3342 Major depressive disorder, recurrent, in full remission: Secondary | ICD-10-CM | POA: Diagnosis not present

## 2016-03-17 DIAGNOSIS — H6121 Impacted cerumen, right ear: Secondary | ICD-10-CM | POA: Diagnosis not present

## 2016-03-17 MED ORDER — FLUOXETINE HCL 20 MG PO CAPS: 20.0000 mg | ORAL_CAPSULE | Freq: Every day | ORAL | 11 refills | Status: DC

## 2016-03-17 NOTE — Patient Instructions (Signed)
Major Depressive Disorder Major depressive disorder is a mental illness. It also may be called clinical depression or unipolar depression. Major depressive disorder usually causes feelings of sadness, hopelessness, or helplessness. Some people with this disorder do not feel particularly sad but lose interest in doing things they used to enjoy (anhedonia). Major depressive disorder also can cause physical symptoms. It can interfere with work, school, relationships, and other normal everyday activities. The disorder varies in severity but is longer lasting and more serious than the sadness we all feel from time to time in our lives. Major depressive disorder often is triggered by stressful life events or major life changes. Examples of these triggers include divorce, loss of your job or home, a move, and the death of a family member or close friend. Sometimes this disorder occurs for no obvious reason at all. People who have family members with major depressive disorder or bipolar disorder are at higher risk for developing this disorder, with or without life stressors. Major depressive disorder can occur at any age. It may occur just once in your life (single episode major depressive disorder). It may occur multiple times (recurrent major depressive disorder). SYMPTOMS People with major depressive disorder have either anhedonia or depressed mood on nearly a daily basis for at least 2 weeks or longer. Symptoms of depressed mood include:  Feelings of sadness (blue or down in the dumps) or emptiness.  Feelings of hopelessness or helplessness.  Tearfulness or episodes of crying (may be observed by others).  Irritability (children and adolescents). In addition to depressed mood or anhedonia or both, people with this disorder have at least four of the following symptoms:  Difficulty sleeping or sleeping too much.   Significant change (increase or decrease) in appetite or weight.   Lack of energy or  motivation.  Feelings of guilt and worthlessness.   Difficulty concentrating, remembering, or making decisions.  Unusually slow movement (psychomotor retardation) or restlessness (as observed by others).   Recurrent wishes for death, recurrent thoughts of self-harm (suicide), or a suicide attempt. People with major depressive disorder commonly have persistent negative thoughts about themselves, other people, and the world. People with severe major depressive disorder may experiencedistorted beliefs or perceptions about the world (psychotic delusions). They also may see or hear things that are not real (psychotic hallucinations). DIAGNOSIS Major depressive disorder is diagnosed through an assessment by your health care provider. Your health care provider will ask aboutaspects of your daily life, such as mood,sleep, and appetite, to see if you have the diagnostic symptoms of major depressive disorder. Your health care provider may ask about your medical history and use of alcohol or drugs, including prescription medicines. Your health care provider also may do a physical exam and blood work. This is because certain medical conditions and the use of certain substances can cause major depressive disorder-like symptoms (secondary depression). Your health care provider also may refer you to a mental health specialist for further evaluation and treatment. TREATMENT It is important to recognize the symptoms of major depressive disorder and seek treatment. The following treatments can be prescribed for this disorder:   Medicine. Antidepressant medicines usually are prescribed. Antidepressant medicines are thought to correct chemical imbalances in the brain that are commonly associated with major depressive disorder. Other types of medicine may be added if the symptoms do not respond to antidepressant medicines alone or if psychotic delusions or hallucinations occur.  Talk therapy. Talk therapy can be  helpful in treating major depressive disorder by providing   support, education, and guidance. Certain types of talk therapy also can help with negative thinking (cognitive behavioral therapy) and with relationship issues that trigger this disorder (interpersonal therapy). A mental health specialist can help determine which treatment is best for you. Most people with major depressive disorder do well with a combination of medicine and talk therapy. Treatments involving electrical stimulation of the brain can be used in situations with extremely severe symptoms or when medicine and talk therapy do not work over time. These treatments include electroconvulsive therapy, transcranial magnetic stimulation, and vagal nerve stimulation.   This information is not intended to replace advice given to you by your health care provider. Make sure you discuss any questions you have with your health care provider.   Document Released: 08/30/2012 Document Revised: 05/26/2014 Document Reviewed: 08/30/2012 Elsevier Interactive Patient Education 2016 [employer] Inc.  

## 2016-03-17 NOTE — Progress Notes (Signed)
BP 130/88   Pulse 93   Temp 97.7 F (36.5 C) (Oral)   Ht 5\' 5"  (1.651 m)   Wt 159 lb 3.2 oz (72.2 kg)   BMI 26.49 kg/m    Subjective:    Patient ID: Amy Jackson, female    DOB: [birth year], 30 y.o.   MRN: [id number]  HPI: Amy Jackson is a 30 y.o. female presenting on 03/17/2016 for ear stopped up (right )  Impacted cerumen right canal, has had issues with this before.  Feels that hearing is down.  She also has had less success with the prozac tablet and would like to back on the capsule.  She has been doing well otherwise.  Past Medical History:  Diagnosis Date  . Complication of anesthesia    spinal anes- unsuccessful for 1st CS  . Depression   . Family history of adverse reaction to anesthesia    mother - difficult intubation  . Kidney infection 04/2015   Relevant past medical, surgical, family and social history reviewed and updated as indicated. Interim medical history since our last visit reviewed. Allergies and medications reviewed and updated. DATA REVIEWED: CHART IN EPIC  Social History   Social History  . Marital status: Married    Spouse name: N/A  . Number of children: N/A  . Years of education: N/A   Occupational History  . Not on file.   Social History Main Topics  . Smoking status: Never Smoker  . Smokeless tobacco: Never Used  . Alcohol use No  . Drug use: No  . Sexual activity: Yes    Birth control/ protection: Pill   Other Topics Concern  . Not on file   Social History Narrative  . No narrative on file    Past Surgical History:  Procedure Laterality Date  . CESAREAN SECTION     x1  . CESAREAN SECTION N/A 02/16/2015   Procedure: REPEAT CESAREAN SECTION;  Surgeon: Waynard ReedsKendra Ross, MD;  Location: [facility];  Service: Obstetrics;  Laterality: N/A;  . DILATION AND CURETTAGE OF UTERUS    . MOUTH SURGERY      Family History  Problem Relation Age of Onset  . Diabetes Mother   . Hypertension Mother   . Hyperlipidemia Father   .  Hypertension Father   . Cancer Maternal Grandmother   . Diabetes Maternal Grandmother   . Heart disease Paternal Grandmother     MI    Review of Systems  Constitutional: Negative.  Negative for activity change, fatigue and fever.  HENT: Positive for ear pain and hearing loss. Negative for congestion, facial swelling, nosebleeds and postnasal drip.   Eyes: Negative.   Respiratory: Negative.  Negative for cough.   Cardiovascular: Negative.  Negative for chest pain.  Gastrointestinal: Negative.  Negative for abdominal pain.  Endocrine: Negative.   Genitourinary: Negative.  Negative for dysuria.  Musculoskeletal: Negative.   Skin: Negative.   Neurological: Negative.   Psychiatric/Behavioral: Positive for decreased concentration and dysphoric mood.      Medication List       Accurate as of 03/17/16 10:32 PM. Always use your most recent med list.          FLUoxetine 20 MG capsule Commonly known as:  PROZAC Take 1 capsule (20 mg total) by mouth daily.   PORTIA-28 0.15-30 MG-MCG tablet Generic drug:  levonorgestrel-ethinyl estradiol Take 1 tablet by mouth daily. Reported on 05/10/2015          Objective:  BP 130/88   Pulse 93   Temp 97.7 F (36.5 C) (Oral)   Ht 5\' 5"  (1.651 m)   Wt 159 lb 3.2 oz (72.2 kg)   BMI 26.49 kg/m   Allergies  Allergen Reactions  . Dilaudid [Hydromorphone Hcl] Itching    Wt Readings from Last 3 Encounters:  03/17/16 159 lb 3.2 oz (72.2 kg)  06/13/15 152 lb 12.8 oz (69.3 kg)  05/24/15 154 lb 6.4 oz (70 kg)    Physical Exam  Constitutional: She is oriented to person, place, and time. She appears well-developed and well-nourished.  HENT:  Head: Normocephalic and atraumatic.  Left Ear: Tympanic membrane and ear canal normal.  Right canal cerumen impaction, unsuccessful irrigation  Eyes: Conjunctivae and EOM are normal. Pupils are equal, round, and reactive to light.  Cardiovascular: Normal rate, regular rhythm, normal heart sounds  and intact distal pulses.   Pulmonary/Chest: Effort normal and breath sounds normal.  Abdominal: Soft. Bowel sounds are normal.  Neurological: She is alert and oriented to person, place, and time. She has normal reflexes.  Skin: Skin is warm and dry. No rash noted.  Psychiatric: She has a normal mood and affect. Her behavior is normal. Judgment and thought content normal.  Nursing note and vitals reviewed.       Assessment & Plan:   1. Recurrent major depressive disorder, in full remission (HCC) - FLUoxetine (PROZAC) 20 MG capsule; Take 1 capsule (20 mg total) by mouth daily.  Dispense: 30 capsule; Refill: 11  2. Impacted cerumen of right ear Wasg once daily with peroxide, if not resolved in a week, call office  Continue all other maintenance medications as listed above.  Follow up plan: Return if symptoms worsen or fail to improve.  Educational handout given for depression  Remus LofflerAngel S. Hera Celaya PA-C [facility] [address]  [place], [place] [id number] [phone]   03/17/2016, 10:32 PM

## 2016-03-20 DIAGNOSIS — H52223 Regular astigmatism, bilateral: Secondary | ICD-10-CM | POA: Diagnosis not present

## 2016-09-10 DIAGNOSIS — Z01419 Encounter for gynecological examination (general) (routine) without abnormal findings: Secondary | ICD-10-CM | POA: Diagnosis not present

## 2016-09-10 DIAGNOSIS — Z124 Encounter for screening for malignant neoplasm of cervix: Secondary | ICD-10-CM | POA: Diagnosis not present

## 2016-10-01 ENCOUNTER — Encounter: Payer: Self-pay | Admitting: Physician Assistant

## 2016-10-01 ENCOUNTER — Ambulatory Visit (INDEPENDENT_AMBULATORY_CARE_PROVIDER_SITE_OTHER): Payer: 59 | Admitting: Physician Assistant

## 2016-10-01 VITALS — BP 129/87 | HR 108 | Temp 98.4°F | Ht 65.0 in | Wt 157.0 lb

## 2016-10-01 DIAGNOSIS — M545 Low back pain: Secondary | ICD-10-CM

## 2016-10-01 DIAGNOSIS — N3000 Acute cystitis without hematuria: Secondary | ICD-10-CM | POA: Diagnosis not present

## 2016-10-01 DIAGNOSIS — R350 Frequency of micturition: Secondary | ICD-10-CM

## 2016-10-01 LAB — URINALYSIS, COMPLETE
Bilirubin, UA: NEGATIVE
GLUCOSE, UA: NEGATIVE
Leukocytes, UA: NEGATIVE
Nitrite, UA: NEGATIVE
PROTEIN UA: NEGATIVE
RBC, UA: NEGATIVE
Specific Gravity, UA: 1.02 (ref 1.005–1.030)
UUROB: 1 mg/dL (ref 0.2–1.0)
pH, UA: 6.5 (ref 5.0–7.5)

## 2016-10-01 LAB — MICROSCOPIC EXAMINATION
RBC, UA: NONE SEEN /hpf (ref 0–?)
Renal Epithel, UA: NONE SEEN /hpf

## 2016-10-01 MED ORDER — SULFAMETHOXAZOLE-TRIMETHOPRIM 800-160 MG PO TABS: 1.0000 | ORAL_TABLET | Freq: Two times a day (BID) | ORAL | 0 refills | Status: DC

## 2016-10-01 NOTE — Patient Instructions (Signed)

## 2016-10-02 LAB — URINE CULTURE

## 2016-10-02 NOTE — Progress Notes (Signed)
BP 129/87   Pulse (!) 108   Temp 98.4 F (36.9 C) (Oral)   Ht 5\' 5"  (1.651 m)   Wt 157 lb (71.2 kg)   BMI 26.13 kg/m    Subjective:    Patient ID: Amy Jackson, female    DOB: 04-Mar-1986, 31 y.o.   MRN: [id number]  HPI: Amy Jackson is a 31 y.o. female presenting on 10/01/2016 for Back Pain (x 1 week); Urinary Urgency; and Nausea  This patient has had one severe urinary tract infection in the past couple years. She even was hospitalized for sepsis. At that time she did not have the typical symptoms that you have of dysuria, frequency or lower abdominal pain. At this time she is still not having those type of symptoms. She has a little bit of discomfort in her abdomen and nausea and does feel that she is going on little bit more then often. She has some generalized aching. And was concerned about a urinary tract infection.  Relevant past medical, surgical, family and social history reviewed and updated as indicated. Allergies and medications reviewed and updated.  Past Medical History:  Diagnosis Date  . Complication of anesthesia    spinal anes- unsuccessful for 1st CS  . Depression   . Family history of adverse reaction to anesthesia    mother - difficult intubation  . Kidney infection 04/2015    Past Surgical History:  Procedure Laterality Date  . CESAREAN SECTION     x1  . CESAREAN SECTION N/A 02/16/2015   Procedure: REPEAT CESAREAN SECTION;  Surgeon: Waynard Reeds, MD;  Location: [facility];  Service: Obstetrics;  Laterality: N/A;  . DILATION AND CURETTAGE OF UTERUS    . MOUTH SURGERY      Review of Systems  Constitutional: Negative.   HENT: Negative.   Eyes: Negative.   Respiratory: Negative.   Gastrointestinal: Negative.   Genitourinary: Positive for frequency and urgency. Negative for difficulty urinating, dyspareunia, dysuria, flank pain and hematuria.    Allergies as of 10/01/2016      Reactions   Dilaudid [hydromorphone Hcl] Itching      Medication List         Accurate as of 10/01/16 11:59 PM. Always use your most recent med list.          FLUoxetine 20 MG capsule Commonly known as:  PROZAC Take 1 capsule (20 mg total) by mouth daily.   PORTIA-28 0.15-30 MG-MCG tablet Generic drug:  levonorgestrel-ethinyl estradiol Take 1 tablet by mouth daily. Reported on 05/10/2015   sulfamethoxazole-trimethoprim 800-160 MG tablet Commonly known as:  BACTRIM DS,SEPTRA DS Take 1 tablet by mouth 2 (two) times daily.          Objective:    BP 129/87   Pulse (!) 108   Temp 98.4 F (36.9 C) (Oral)   Ht 5\' 5"  (1.651 m)   Wt 157 lb (71.2 kg)   BMI 26.13 kg/m   Allergies  Allergen Reactions  . Dilaudid [Hydromorphone Hcl] Itching    Physical Exam  Constitutional: She is oriented to person, place, and time. She appears well-developed and well-nourished.  HENT:  Head: Normocephalic and atraumatic.  Eyes: Conjunctivae are normal. Pupils are equal, round, and reactive to light.  Cardiovascular: Normal rate, regular rhythm, normal heart sounds and intact distal pulses.   Pulmonary/Chest: Effort normal and breath sounds normal.  Abdominal: Soft. Bowel sounds are normal. She exhibits no distension and no mass. There is tenderness in the suprapubic  area. There is no rebound, no guarding and no CVA tenderness.  Neurological: She is alert and oriented to person, place, and time. She has normal reflexes.  Skin: Skin is warm and dry. No rash noted.  Psychiatric: She has a normal mood and affect. Her behavior is normal. Judgment and thought content normal.    Results for orders placed or performed in visit on 10/01/16  Microscopic Examination  Result Value Ref Range   WBC, UA 0-5 0 - 5 /hpf   RBC, UA None seen 0 - 2 /hpf   Epithelial Cells (non renal) 0-10 0 - 10 /hpf   Renal Epithel, UA None seen None seen /hpf   Bacteria, UA Moderate (A) None seen/Few  Urinalysis, Complete  Result Value Ref Range   Specific Gravity, UA 1.020 1.005 - 1.030    pH, UA 6.5 5.0 - 7.5   Color, UA Yellow Yellow   Appearance Ur Clear Clear   Leukocytes, UA Negative Negative   Protein, UA Negative Negative/Trace   Glucose, UA Negative Negative   Ketones, UA Trace (A) Negative   RBC, UA Negative Negative   Bilirubin, UA Negative Negative   Urobilinogen, Ur 1.0 0.2 - 1.0 mg/dL   Nitrite, UA Negative Negative   Microscopic Examination See below:       Assessment & Plan:   1. Low back pain, unspecified back pain laterality, unspecified chronicity, with sciatica presence unspecified - Urinalysis, Complete  2. Frequency of micturition - Urinalysis, Complete  3. Acute cystitis without hematuria - sulfamethoxazole-trimethoprim (BACTRIM DS,SEPTRA DS) 800-160 MG tablet; Take 1 tablet by mouth 2 (two) times daily.  Dispense: 20 tablet; Refill: 0 - Urine culture - Microscopic Examination   Current Outpatient Prescriptions:  .  FLUoxetine (PROZAC) 20 MG capsule, Take 1 capsule (20 mg total) by mouth daily., Disp: 30 capsule, Rfl: 11 .  levonorgestrel-ethinyl estradiol (PORTIA-28) 0.15-30 MG-MCG tablet, Take 1 tablet by mouth daily. Reported on 05/10/2015, Disp: , Rfl:  .  sulfamethoxazole-trimethoprim (BACTRIM DS,SEPTRA DS) 800-160 MG tablet, Take 1 tablet by mouth 2 (two) times daily., Disp: 20 tablet, Rfl: 0 Continue all other maintenance medications as listed above.  Follow up plan: Return if symptoms worsen or fail to improve.  Educational handout given for UTI  Remus LofflerAngel S. Sherrell Farish PA-C [facility] [address]  [place], [place] [id number] [phone]   10/02/2016, 2:18 PM

## 2016-11-15 IMAGING — CR DG CHEST 2V
2 series · 2 of 2 positions shown · non-contrast
Comparison: None.

CLINICAL DATA: Fever and tachycardia

EXAM:
CHEST  2 VIEW

[w chest pa]
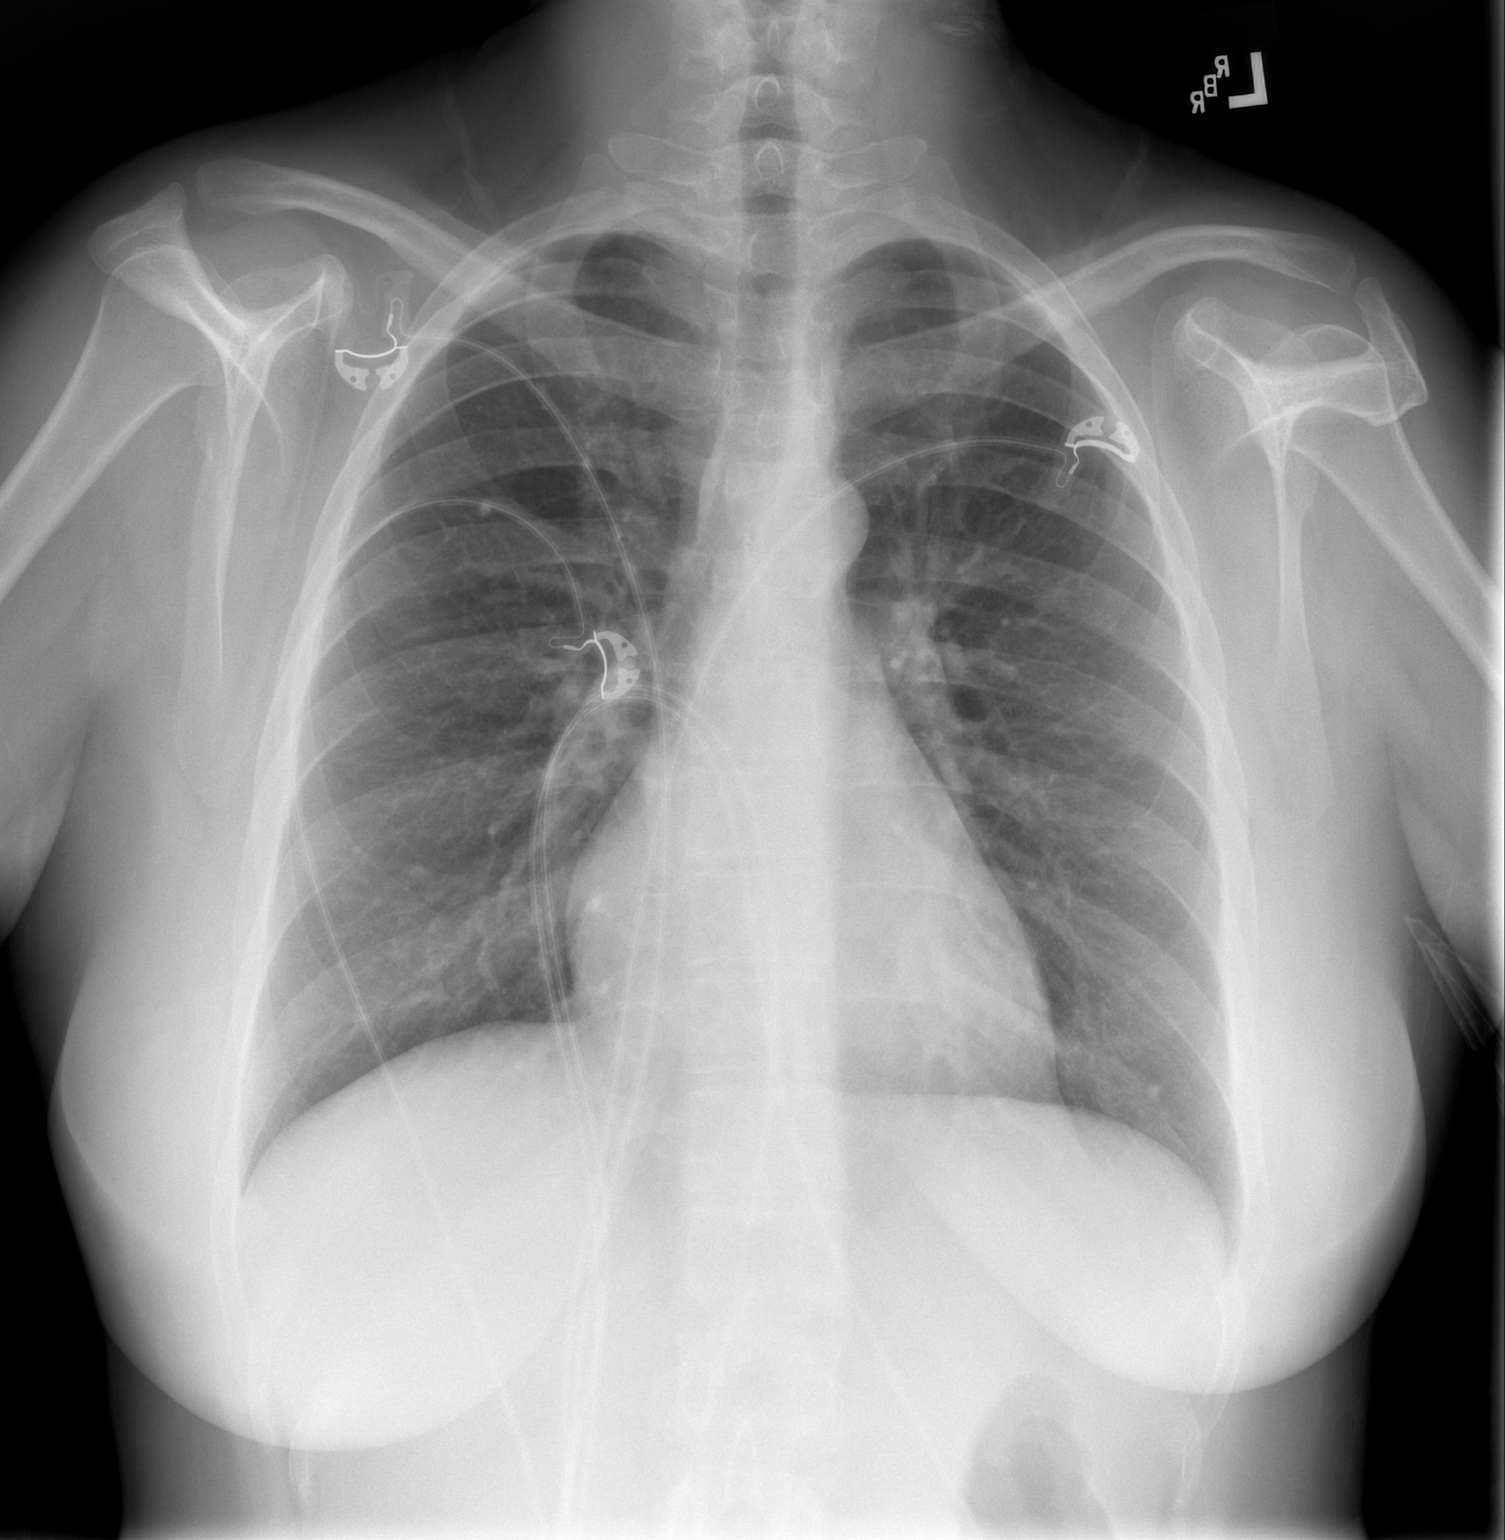

[w chest lat]
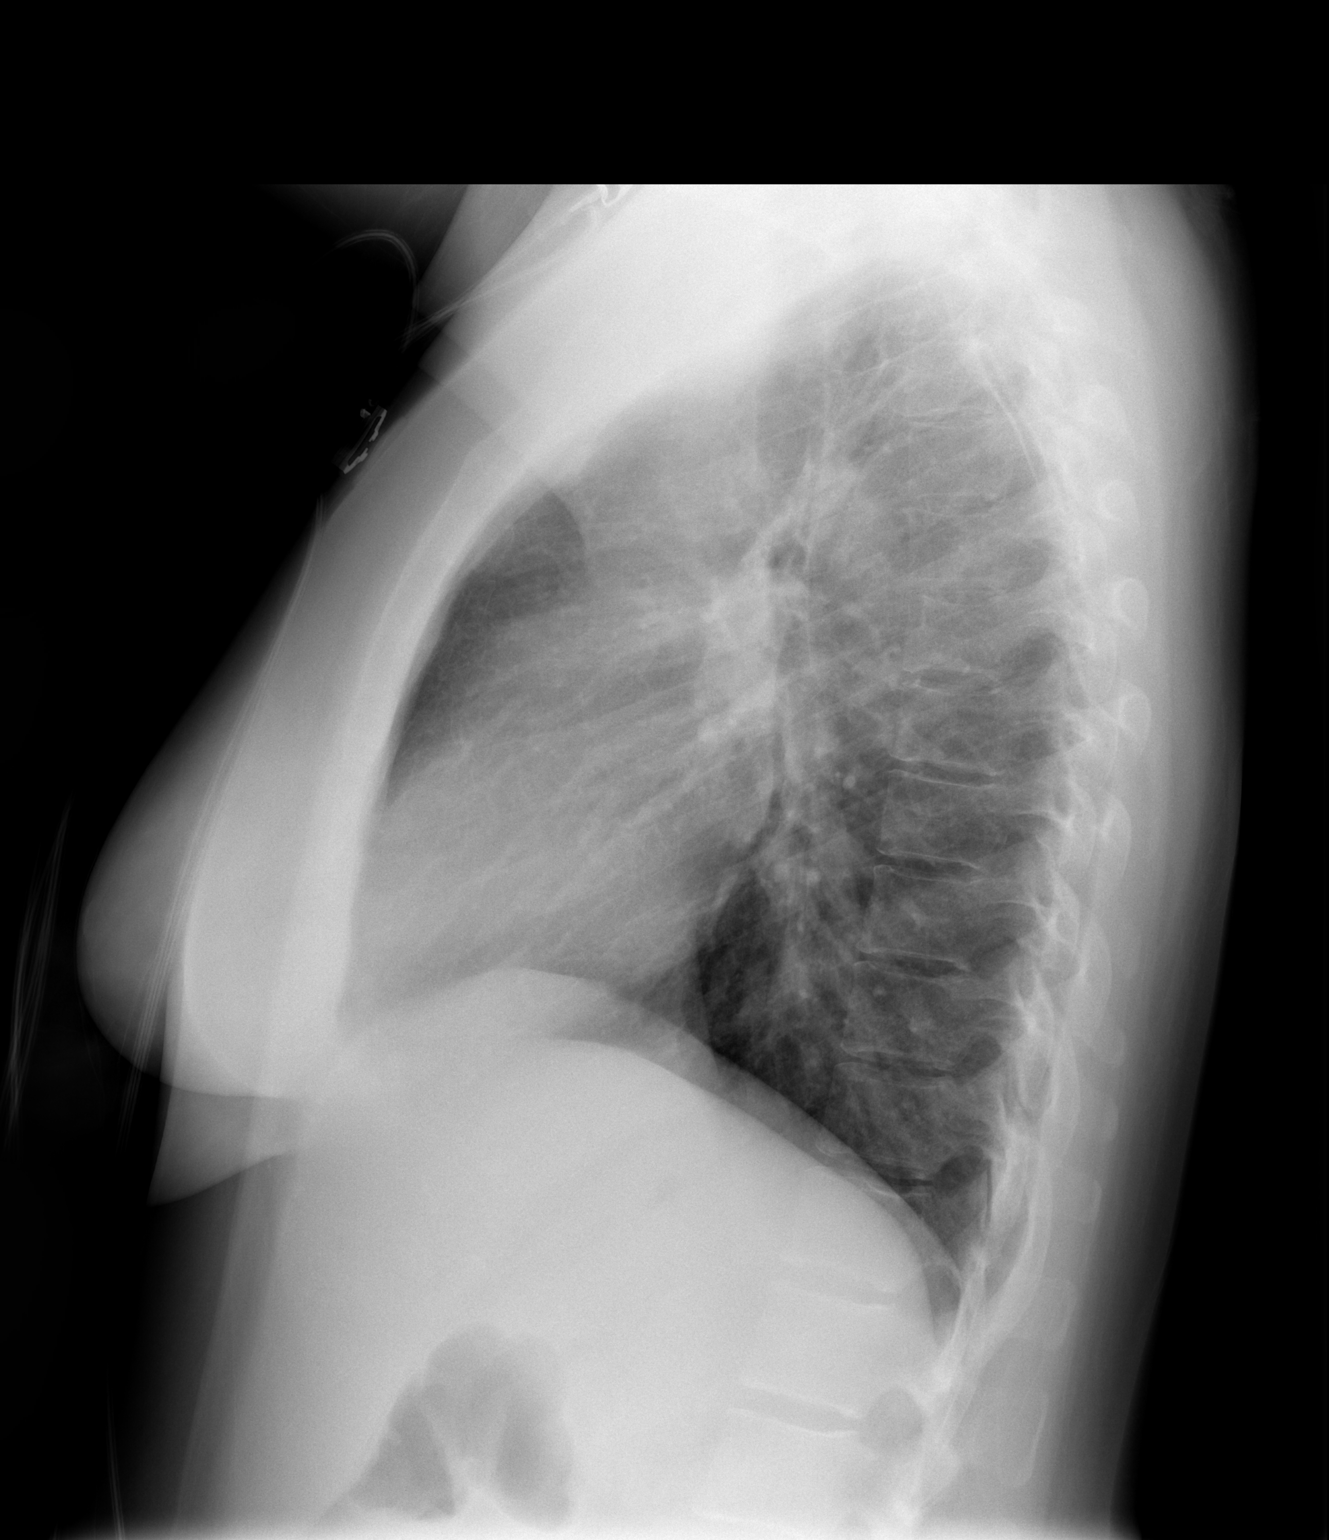

[2 of 2 positions shown; findings below may reference images not displayed]

FINDINGS: Normal heart size and mediastinal contours. No acute infiltrate or
edema. No effusion or pneumothorax. No acute osseous findings.
IMPRESSION: Negative chest.

## 2016-11-15 IMAGING — CT CT ABD-PELV W/ CM
2 of 4 series · 16 of 46 positions shown, 18 images · IV contrast (omnipaque)
Comparison: None.

CLINICAL DATA: Acute right lower quadrant abdominal pain, fever,
nausea.

EXAM:
CT ABDOMEN AND PELVIS WITH CONTRAST
TECHNIQUE: Multidetector CT imaging of the abdomen and pelvis was performed
using the standard protocol following bolus administration of
intravenous contrast.
CONTRAST:  100mL OMNIPAQUE IOHEXOL 300 MG/ML SOLN, 25mL OMNIPAQUE
IOHEXOL 300 MG/ML SOLN

[Series 2: abd/pelvis 5.0 b31f · axial · 0.81mm/px · z∈[-497,-52]mm · 13 of 97 slices shown, 15 images]
[im 4/97  soft-tissue]
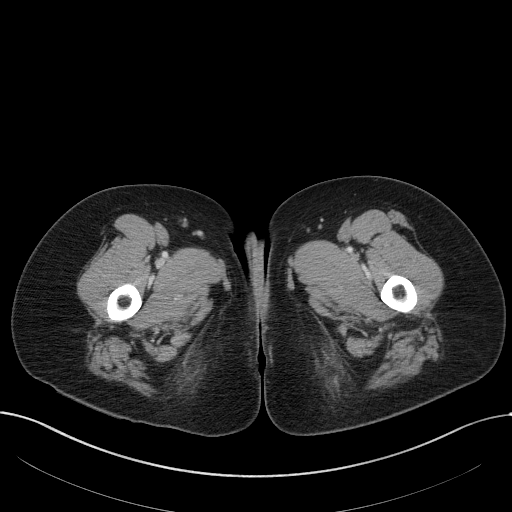
[im 4/97  bone]
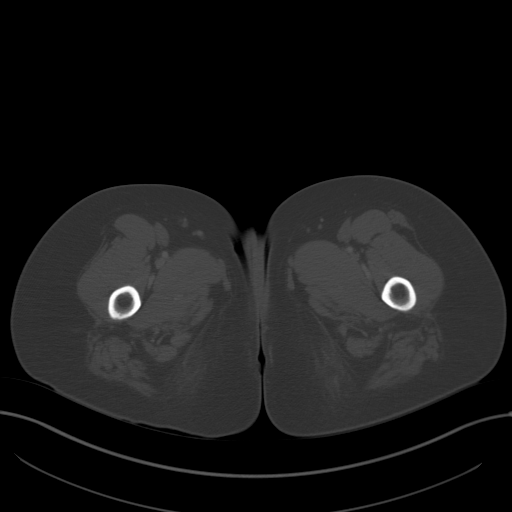
[im 12/97  soft-tissue]
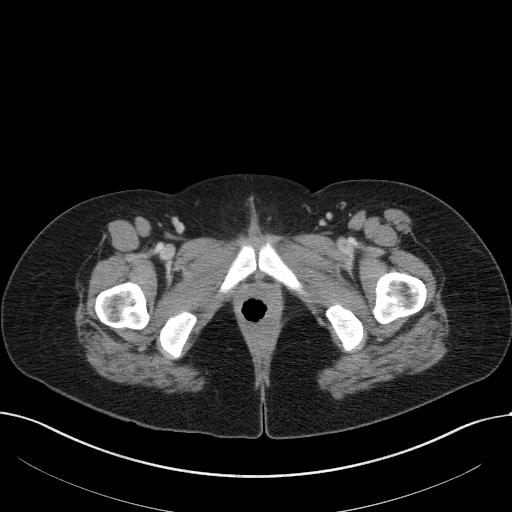
[im 20/97  soft-tissue]
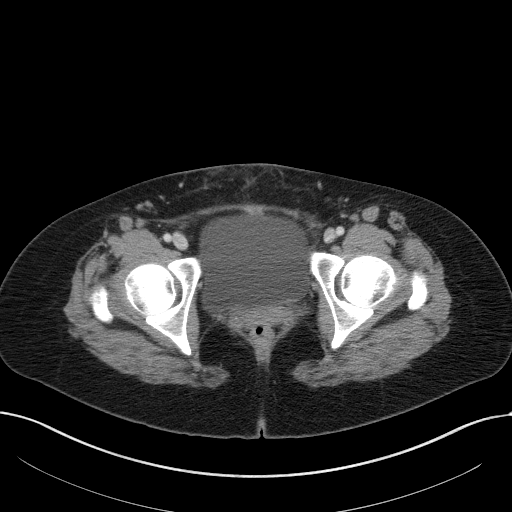
[im 27/97  soft-tissue]
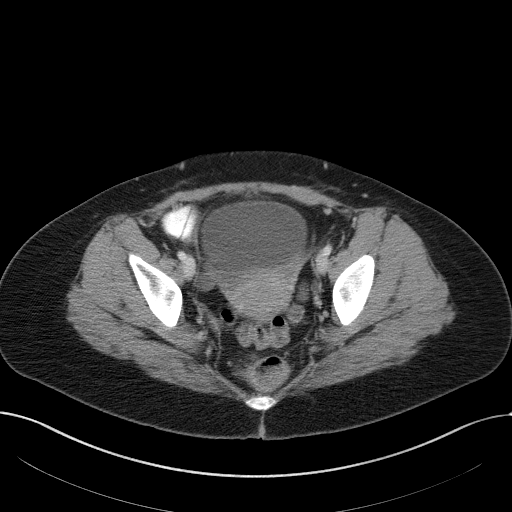
[im 35/97  soft-tissue]
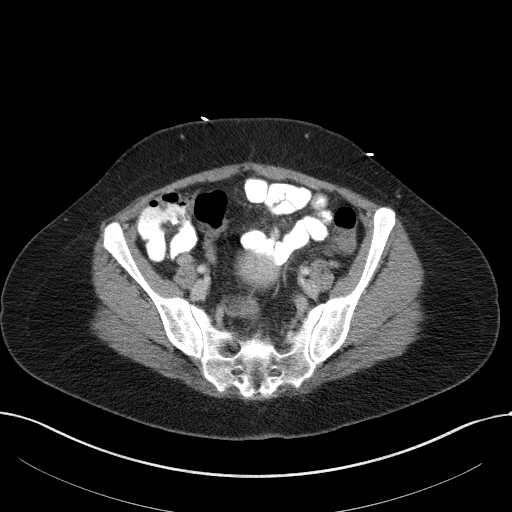
[im 43/97  soft-tissue]
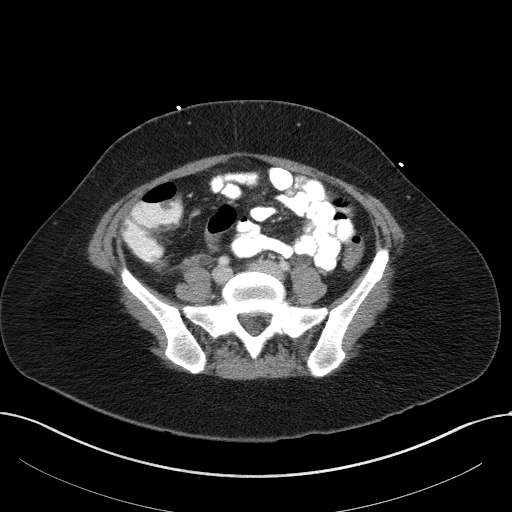
[im 50/97  soft-tissue]
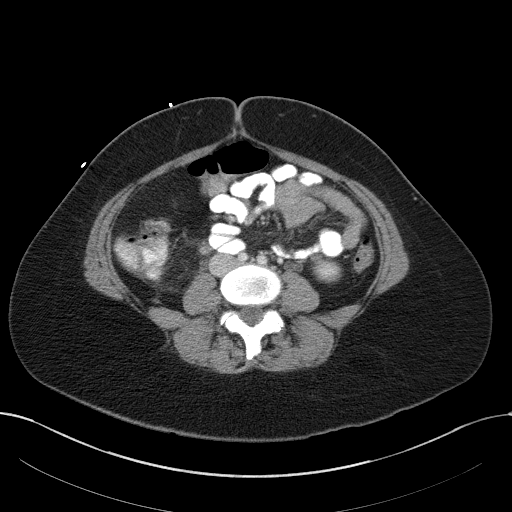
[im 54/97  soft-tissue]
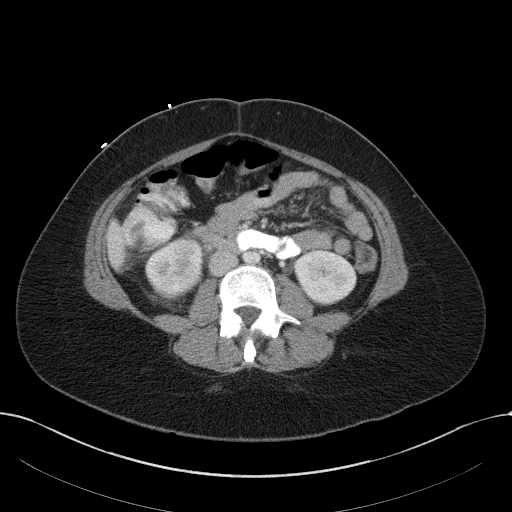
[im 62/97  soft-tissue]
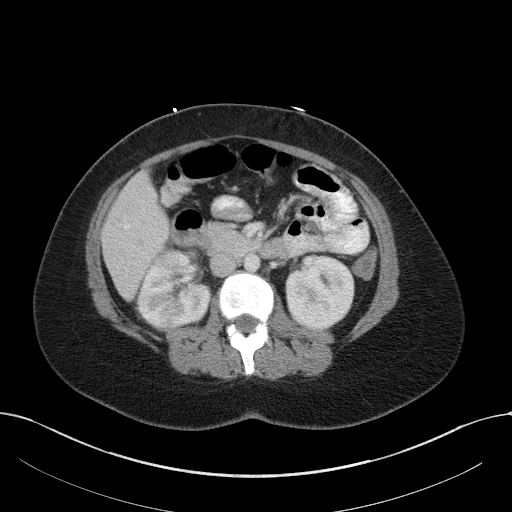
[im 62/97  bone]
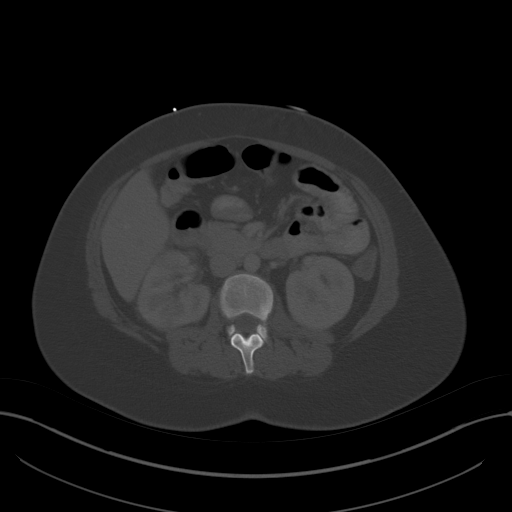
[im 70/97  soft-tissue]
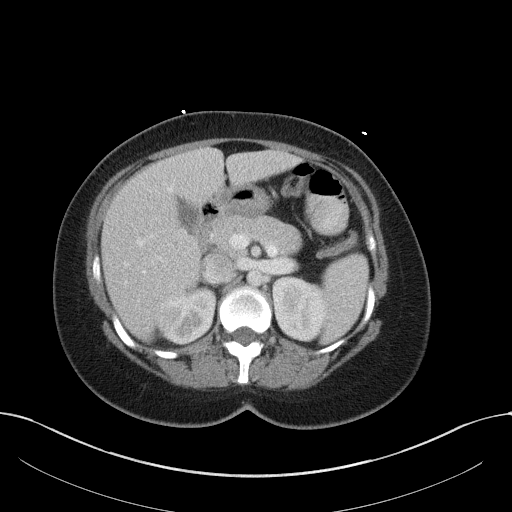
[im 77/97  soft-tissue]
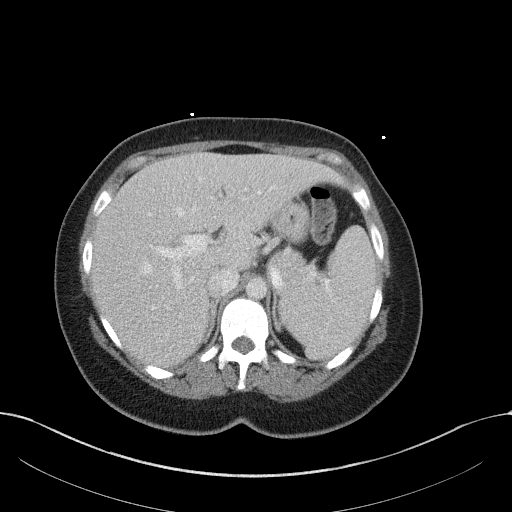
[im 85/97  soft-tissue]
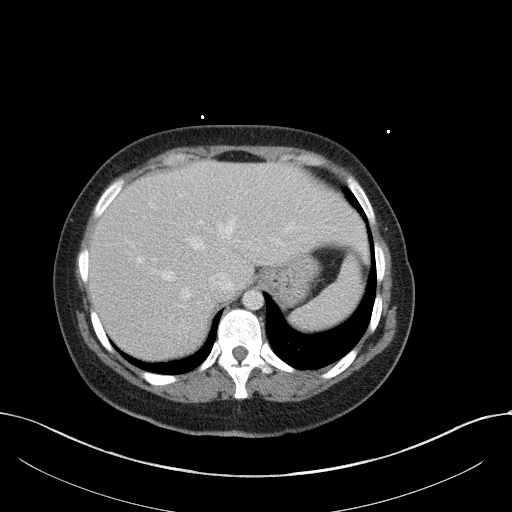
[im 93/97  soft-tissue]
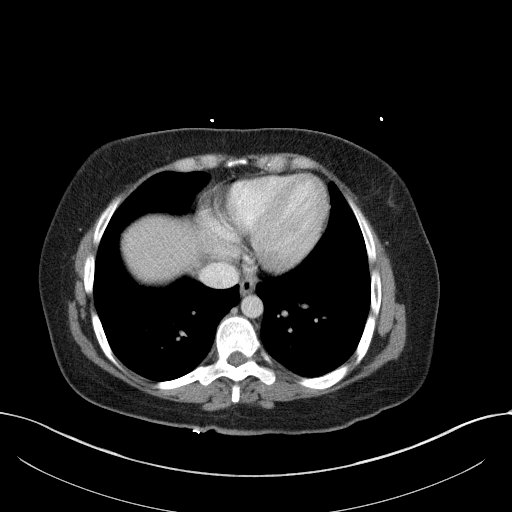

[Series 5: abd/pelvis 3.0 coronal · coronal · 0.89mm/px · 3 of 92 slices shown]
[im 31/92  soft-tissue]
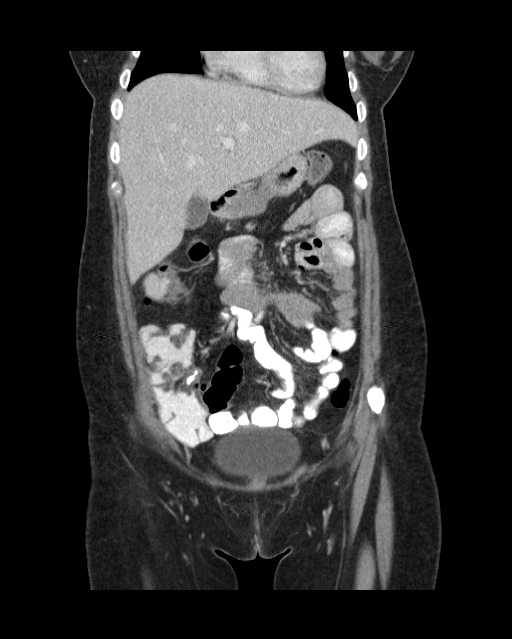
[im 41/92  soft-tissue]
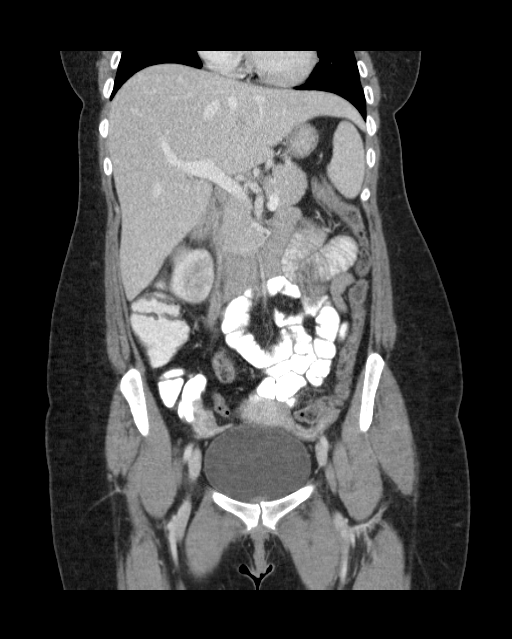
[im 51/92  soft-tissue]
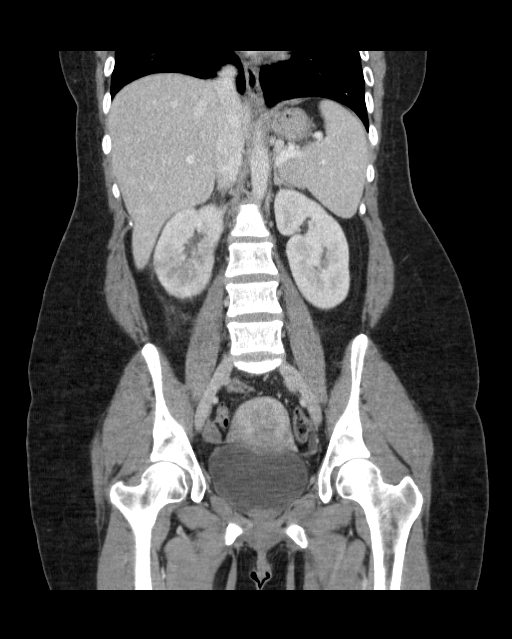

[16 of 46 positions shown; findings below may reference images not displayed]

FINDINGS: Visualized lung bases appear normal. No significant osseous
abnormality is noted.

No gallstones are noted. The liver, spleen and pancreas appear
normal. Adrenal glands appear normal. Left kidney appears normal. No
hydronephrosis or renal obstruction is noted. No renal or ureteral
calculi are noted. The right kidney demonstrates hazy contours with
minimal surrounding fluid most consistent with pyelonephritis. The
appendix appears normal. There is no evidence of bowel obstruction.
No abnormal fluid collection is noted. Uterus and ovaries appear
normal. Urinary bladder appears normal. No significant adenopathy is
noted.
IMPRESSION: Findings most consistent with right-sided pyelonephritis. No
hydronephrosis or renal obstruction is noted. No renal or ureteral
calculi are noted.

## 2017-03-19 ENCOUNTER — Other Ambulatory Visit: Payer: Self-pay | Admitting: Physician Assistant

## 2017-03-19 DIAGNOSIS — F3342 Major depressive disorder, recurrent, in full remission: Secondary | ICD-10-CM

## 2017-07-14 ENCOUNTER — Ambulatory Visit (INDEPENDENT_AMBULATORY_CARE_PROVIDER_SITE_OTHER): Payer: 59 | Admitting: Physician Assistant

## 2017-07-14 ENCOUNTER — Encounter: Payer: Self-pay | Admitting: Physician Assistant

## 2017-07-14 VITALS — BP 120/79 | HR 88 | Temp 97.1°F | Ht 65.0 in | Wt 162.8 lb

## 2017-07-14 DIAGNOSIS — Z0000 Encounter for general adult medical examination without abnormal findings: Secondary | ICD-10-CM

## 2017-07-14 LAB — BAYER DCA HB A1C WAIVED: HB A1C: 5 % (ref <7.0)

## 2017-07-14 NOTE — Progress Notes (Signed)
BP 120/79   Pulse 88   Temp (!) 97.1 F (36.2 C) (Oral)   Ht _0  (1.651 m)   Wt 162 lb 12.8 oz (73.8 kg)   BMI 27.09 kg/m    Subjective:    Patient ID: Amy Jackson, female    DOB: 05-30-1985, 32 y.o.   MRN: [id number]  HPI: Amy Jackson is a 32 y.o. female presenting on 07/14/2017 for Annual Exam and Fatigue  This patient comes in for annual well physical examination. All medications are reviewed today. There are no reports of any problems with the medications. All of the medical conditions are reviewed and updated.  Lab work is reviewed and will be ordered as medically necessary. There are no new problems reported with today's visit.  Patient reports doing well overall.   Past Medical History:  Diagnosis Date  . Complication of anesthesia    spinal anes- unsuccessful for 1st CS  . Depression   . Family history of adverse reaction to anesthesia    mother - difficult intubation  . Kidney infection 04/2015   Relevant past medical, surgical, family and social history reviewed and updated as indicated. Interim medical history since our last visit reviewed. Allergies and medications reviewed and updated. DATA REVIEWED: CHART IN EPIC  Family History reviewed for pertinent findings.  Review of Systems  Constitutional: Positive for fatigue. Negative for activity change and fever.  HENT: Negative.   Eyes: Negative.   Respiratory: Negative.  Negative for cough.   Cardiovascular: Negative.  Negative for chest pain.  Gastrointestinal: Negative.  Negative for abdominal pain.  Endocrine: Negative.   Genitourinary: Negative.  Negative for dysuria.  Musculoskeletal: Negative.   Skin: Negative.   Neurological: Negative.     Allergies as of 07/14/2017      Reactions   Dilaudid [hydromorphone Hcl] Itching      Medication List        Accurate as of 07/14/17 12:37 PM. Always use your most recent med list.          FLUoxetine 20 MG capsule Commonly known as:   PROZAC TAKE 1 CAPSULE (20 MG TOTAL) BY MOUTH DAILY.   PORTIA-28 0.15-30 MG-MCG tablet Generic drug:  levonorgestrel-ethinyl estradiol Take 1 tablet by mouth daily. Reported on 05/10/2015          Objective:    BP 120/79   Pulse 88   Temp (!) 97.1 F (36.2 C) (Oral)   Ht _1  (1.651 m)   Wt 162 lb 12.8 oz (73.8 kg)   BMI 27.09 kg/m   Allergies  Allergen Reactions  . Dilaudid [Hydromorphone Hcl] Itching    Wt Readings from Last 3 Encounters:  07/14/17 162 lb 12.8 oz (73.8 kg)  10/01/16 157 lb (71.2 kg)  03/17/16 159 lb 3.2 oz (72.2 kg)    Physical Exam  Constitutional: She is oriented to person, place, and time. She appears well-developed and well-nourished.  HENT:  Head: Normocephalic and atraumatic.  Right Ear: Tympanic membrane, external ear and ear canal normal.  Left Ear: Tympanic membrane, external ear and ear canal normal.  Nose: Nose normal. No rhinorrhea.  Mouth/Throat: Oropharynx is clear and moist and mucous membranes are normal. No oropharyngeal exudate or posterior oropharyngeal erythema.  Eyes: Conjunctivae and EOM are normal. Pupils are equal, round, and reactive to light.  Neck: Normal range of motion. Neck supple.  Cardiovascular: Normal rate, regular rhythm, normal heart sounds and intact distal pulses.  Pulmonary/Chest: Effort normal and breath  sounds normal.  Abdominal: Soft. Bowel sounds are normal.  Neurological: She is alert and oriented to person, place, and time. She has normal reflexes.  Skin: Skin is warm and dry. No rash noted.  Psychiatric: She has a normal mood and affect. Her behavior is normal. Judgment and thought content normal.        Assessment & Plan:   1. Well adult exam - CBC with Differential/Platelet - CMP14+EGFR - Lipid panel - TSH - Bayer DCA Hb A1c Waived   Continue all other maintenance medications as listed above.  Follow up plan: Return in about 1 year (around 07/14/2018) for annual.  Educational handout  given for health maintenance  Terald Sleeper PA-C [facility] [address]  [place], [place] [address] [phone]   07/14/2017, 12:37 PM

## 2017-07-14 NOTE — Patient Instructions (Signed)

## 2017-07-15 LAB — CMP14+EGFR
ALBUMIN: 4.2 g/dL (ref 3.5–5.5)
ALK PHOS: 106 IU/L (ref 39–117)
ALT: 14 IU/L (ref 0–32)
AST: 14 IU/L (ref 0–40)
Albumin/Globulin Ratio: 1.3 (ref 1.2–2.2)
BILIRUBIN TOTAL: 0.3 mg/dL (ref 0.0–1.2)
BUN / CREAT RATIO: 16 (ref 9–23)
BUN: 11 mg/dL (ref 6–20)
CHLORIDE: 101 mmol/L (ref 96–106)
CO2: 22 mmol/L (ref 20–29)
Calcium: 9.3 mg/dL (ref 8.7–10.2)
Creatinine, Ser: 0.67 mg/dL (ref 0.57–1.00)
GFR calc Af Amer: 135 mL/min/{1.73_m2} (ref >59)
GFR calc non Af Amer: 118 mL/min/{1.73_m2} (ref >59)
GLOBULIN, TOTAL: 3.2 g/dL (ref 1.5–4.5)
GLUCOSE: 89 mg/dL (ref 65–99)
Potassium: 4.3 mmol/L (ref 3.5–5.2)
SODIUM: 140 mmol/L (ref 134–144)
Total Protein: 7.4 g/dL (ref 6.0–8.5)

## 2017-07-15 LAB — CBC WITH DIFFERENTIAL/PLATELET
BASOS: 1 %
Basophils Absolute: 0.1 10*3/uL (ref 0.0–0.2)
EOS (ABSOLUTE): 0.2 10*3/uL (ref 0.0–0.4)
Eos: 2 %
Hematocrit: 40.8 % (ref 34.0–46.6)
Hemoglobin: 13.7 g/dL (ref 11.1–15.9)
IMMATURE GRANULOCYTES: 0 %
Immature Grans (Abs): 0 10*3/uL (ref 0.0–0.1)
LYMPHS ABS: 2.6 10*3/uL (ref 0.7–3.1)
Lymphs: 30 %
MCH: 28.8 pg (ref 26.6–33.0)
MCHC: 33.6 g/dL (ref 31.5–35.7)
MCV: 86 fL (ref 79–97)
MONOS ABS: 0.5 10*3/uL (ref 0.1–0.9)
Monocytes: 6 %
NEUTROS ABS: 5.3 10*3/uL (ref 1.4–7.0)
NEUTROS PCT: 61 %
PLATELETS: 210 10*3/uL (ref 150–379)
RBC: 4.76 x10E6/uL (ref 3.77–5.28)
RDW: 13.6 % (ref 12.3–15.4)
WBC: 8.7 10*3/uL (ref 3.4–10.8)

## 2017-07-15 LAB — LIPID PANEL
CHOLESTEROL TOTAL: 188 mg/dL (ref 100–199)
Chol/HDL Ratio: 3 ratio (ref 0.0–4.4)
HDL: 62 mg/dL (ref >39)
LDL CALC: 103 mg/dL — AB (ref 0–99)
TRIGLYCERIDES: 116 mg/dL (ref 0–149)
VLDL Cholesterol Cal: 23 mg/dL (ref 5–40)

## 2017-07-15 LAB — TSH: TSH: 1.9 u[IU]/mL (ref 0.450–4.500)

## 2017-09-08 DIAGNOSIS — H5213 Myopia, bilateral: Secondary | ICD-10-CM | POA: Diagnosis not present

## 2017-09-21 ENCOUNTER — Telehealth: Payer: 59 | Admitting: Family

## 2017-09-21 DIAGNOSIS — J020 Streptococcal pharyngitis: Secondary | ICD-10-CM | POA: Diagnosis not present

## 2017-09-21 MED ORDER — AMOXICILLIN-POT CLAVULANATE 875-125 MG PO TABS: 1.0000 | ORAL_TABLET | Freq: Two times a day (BID) | ORAL | 0 refills | Status: AC

## 2017-09-21 NOTE — Progress Notes (Signed)
Thank you for the details you included in the comment boxes. Those details are very helpful in determining the best course of treatment for you and help [place] to provide the best care.  We are sorry that you are not feeling well.  Here is how we plan to help!  Based on what you have shared with me it is likely that you have strep pharyngitis.  Strep pharyngitis is inflammation and infection in the back of the throat.  This is an infection cause by bacteria and is treated with antibiotics.  I have prescribed Augmentin /125mg  one tablet twice daily with food, for 7 days..  For throat pain, we recommend over the counter oral pain relief medications such as acetaminophen or aspirin, or anti-inflammatory medications such as ibuprofen or naproxen sodium. Topical treatments such as oral throat lozenges or sprays may be used as needed. Strep infections are not as easily transmitted as other respiratory infections, however we still recommend that you avoid close contact with loved ones, especially the very young and elderly.  Remember to wash your hands thoroughly throughout the day as this is the number one way to prevent the spread of infection and wipe down door knobs and counters with disinfectant.   Home Care:  Only take medications as instructed by your medical team.  Complete the entire course of an antibiotic.  Do not take these medications with alcohol.  A steam or ultrasonic humidifier can help congestion.  You can place a towel over your head and breathe in the steam from hot water coming from a faucet.  Avoid close contacts especially the very young and the elderly.  Cover your mouth when you cough or sneeze.  Always remember to wash your hands.  Get Help Right Away If:  You develop worsening fever or sinus pain.  You develop a severe head ache or visual changes.  Your symptoms persist after you have completed your treatment plan.  Make sure you  Understand these  instructions.  Will watch your condition.  Will get help right away if you are not doing well or get worse.  Your e-visit answers were reviewed by a board certified advanced clinical practitioner to complete your personal care plan.  Depending on the condition, your plan could have included both over the counter or prescription medications.  If there is a problem please reply  once you have received a response from your provider.  Your safety is important to [place].  If you have drug allergies check your prescription carefully.    You can use MyChart to ask questions about today's visit, request a non-urgent call back, or ask for a work or school excuse for 24 hours related to this e-Visit. If it has been greater than 24 hours you will need to follow up with your provider, or enter a new e-Visit to address those concerns.  You will get an e-mail in the next two days asking about your experience.  I hope that your e-visit has been valuable and will speed your recovery. Thank you for using e-visits.

## 2017-09-22 ENCOUNTER — Ambulatory Visit: Payer: 59 | Admitting: Family Medicine

## 2017-12-31 DIAGNOSIS — Z01419 Encounter for gynecological examination (general) (routine) without abnormal findings: Secondary | ICD-10-CM | POA: Diagnosis not present

## 2017-12-31 DIAGNOSIS — Z124 Encounter for screening for malignant neoplasm of cervix: Secondary | ICD-10-CM | POA: Diagnosis not present

## 2018-04-04 ENCOUNTER — Other Ambulatory Visit: Payer: Self-pay | Admitting: Physician Assistant

## 2018-04-04 DIAGNOSIS — F3342 Major depressive disorder, recurrent, in full remission: Secondary | ICD-10-CM

## 2018-04-23 ENCOUNTER — Encounter: Payer: Self-pay | Admitting: Physician Assistant

## 2018-04-23 ENCOUNTER — Ambulatory Visit (INDEPENDENT_AMBULATORY_CARE_PROVIDER_SITE_OTHER): Payer: 59 | Admitting: Physician Assistant

## 2018-04-23 DIAGNOSIS — F3342 Major depressive disorder, recurrent, in full remission: Secondary | ICD-10-CM

## 2018-04-23 MED ORDER — FLUOXETINE HCL 20 MG PO CAPS: 20.0000 mg | ORAL_CAPSULE | Freq: Every day | ORAL | 11 refills | Status: DC

## 2018-04-23 NOTE — Progress Notes (Signed)
BP 114/69   Pulse 91   Temp (!) 97.3 F (36.3 C) (Oral)   Ht 5' 5"  (1.651 m)   Wt 170 lb (77.1 kg)   BMI 28.29 kg/m    Subjective:    Patient ID: Amy Jackson, female    DOB: 1985/12/20, 32 y.o.   MRN: [id number]  HPI: Amy Jackson is a 32 y.o. female presenting on 04/23/2018 for Depression (medication refill)  This patient comes in for annual check on her medication.  She does have depression.  She is tolerating her Prozac very well.  If she takes it on empty stomach it does hurt her stomach so therefore she takes it after a meal.  This has relieved the problems and she still is having a good relief of her symptoms.  She does go for annual exams with her gynecologist. Depression screen [facility] 2/9 04/23/2018 07/14/2017 10/01/2016 03/17/2016 06/13/2015  Decreased Interest 0 0 0 0 0  Down, Depressed, Hopeless 0 0 0 0 0  PHQ - 2 Score 0 0 0 0 0      Past Medical History:  Diagnosis Date  . Complication of anesthesia    spinal anes- unsuccessful for 1st CS  . Depression   . Family history of adverse reaction to anesthesia    mother - difficult intubation  . Kidney infection 04/2015   Relevant past medical, surgical, family and social history reviewed and updated as indicated. Interim medical history since our last visit reviewed. Allergies and medications reviewed and updated. DATA REVIEWED: CHART IN EPIC  Family History reviewed for pertinent findings.  Review of Systems  Constitutional: Negative.   HENT: Negative.   Eyes: Negative.   Respiratory: Negative.   Gastrointestinal: Negative.   Genitourinary: Negative.   Psychiatric/Behavioral: Negative.  Negative for decreased concentration and dysphoric mood. The patient is not nervous/anxious.     Allergies as of 04/23/2018      Reactions   Dilaudid [hydromorphone Hcl] Itching      Medication List        Accurate as of 04/23/18  9:57 AM. Always use your most recent med list.          FLUoxetine 20 MG  capsule Commonly known as:  PROZAC Take 1 capsule (20 mg total) by mouth daily. (Needs to be seen before next refill)   PORTIA-28 0.15-30 MG-MCG tablet Generic drug:  levonorgestrel-ethinyl estradiol Take 1 tablet by mouth daily. Reported on 05/10/2015          Objective:    BP 114/69   Pulse 91   Temp (!) 97.3 F (36.3 C) (Oral)   Ht 5' 5"  (1.651 m)   Wt 170 lb (77.1 kg)   BMI 28.29 kg/m   Allergies  Allergen Reactions  . Dilaudid [Hydromorphone Hcl] Itching    Wt Readings from Last 3 Encounters:  04/23/18 170 lb (77.1 kg)  07/14/17 162 lb 12.8 oz (73.8 kg)  10/01/16 157 lb (71.2 kg)    Physical Exam  Constitutional: She is oriented to person, place, and time. She appears well-developed and well-nourished.  HENT:  Head: Normocephalic and atraumatic.  Eyes: Pupils are equal, round, and reactive to light. Conjunctivae and EOM are normal.  Cardiovascular: Normal rate, regular rhythm, normal heart sounds and intact distal pulses.  Pulmonary/Chest: Effort normal and breath sounds normal.  Abdominal: Soft. Bowel sounds are normal.  Neurological: She is alert and oriented to person, place, and time. She has normal reflexes.  Skin: Skin  is warm and dry. No rash noted.  Psychiatric: She has a normal mood and affect. Her behavior is normal. Judgment and thought content normal.    Results for orders placed or performed in visit on 07/14/17  CBC with Differential/Platelet  Result Value Ref Range   WBC 8.7 3.4 - 10.8 x10E3/uL   RBC 4.76 3.77 - 5.28 x10E6/uL   Hemoglobin 13.7 11.1 - 15.9 g/dL   Hematocrit 40.8 34.0 - 46.6 %   MCV 86 79 - 97 fL   MCH 28.8 26.6 - 33.0 pg   MCHC 33.6 31.5 - 35.7 g/dL   RDW 13.6 12.3 - 15.4 %   Platelets 210 150 - 379 x10E3/uL   Neutrophils 61 Not Estab. %   Lymphs 30 Not Estab. %   Monocytes 6 Not Estab. %   Eos 2 Not Estab. %   Basos 1 Not Estab. %   Neutrophils Absolute 5.3 1.4 - 7.0 x10E3/uL   Lymphocytes Absolute 2.6 0.7 - 3.1  x10E3/uL   Monocytes Absolute 0.5 0.1 - 0.9 x10E3/uL   EOS (ABSOLUTE) 0.2 0.0 - 0.4 x10E3/uL   Basophils Absolute 0.1 0.0 - 0.2 x10E3/uL   Immature Granulocytes 0 Not Estab. %   Immature Grans (Abs) 0.0 0.0 - 0.1 x10E3/uL  CMP14+EGFR  Result Value Ref Range   Glucose 89 65 - 99 mg/dL   BUN 11 6 - 20 mg/dL   Creatinine, Ser 0.67 0.57 - 1.00 mg/dL   GFR calc non Af Amer 118 >59 mL/min/1.73   GFR calc Af Amer 135 >59 mL/min/1.73   BUN/Creatinine Ratio 16 9 - 23   Sodium 140 134 - 144 mmol/L   Potassium 4.3 3.5 - 5.2 mmol/L   Chloride 101 96 - 106 mmol/L   CO2 22 20 - 29 mmol/L   Calcium 9.3 8.7 - 10.2 mg/dL   Total Protein 7.4 6.0 - 8.5 g/dL   Albumin 4.2 3.5 - 5.5 g/dL   Globulin, Total 3.2 1.5 - 4.5 g/dL   Albumin/Globulin Ratio 1.3 1.2 - 2.2   Bilirubin Total 0.3 0.0 - 1.2 mg/dL   Alkaline Phosphatase 106 39 - 117 IU/L   AST 14 0 - 40 IU/L   ALT 14 0 - 32 IU/L  Lipid panel  Result Value Ref Range   Cholesterol, Total 188 100 - 199 mg/dL   Triglycerides 116 0 - 149 mg/dL   HDL 62 >39 mg/dL   VLDL Cholesterol Cal 23 5 - 40 mg/dL   LDL Calculated 103 (H) 0 - 99 mg/dL   Chol/HDL Ratio 3.0 0.0 - 4.4 ratio  TSH  Result Value Ref Range   TSH 1.900 0.450 - 4.500 uIU/mL  Bayer DCA Hb A1c Waived  Result Value Ref Range   HB A1C (BAYER DCA - WAIVED) 5.0 <7.0 %      Assessment & Plan:   1. Recurrent major depressive disorder, in full remission ([place]) - FLUoxetine (PROZAC) 20 MG capsule; Take 1 capsule (20 mg total) by mouth daily. (Needs to be seen before next refill)  Dispense: 30 capsule; Refill: 11   Continue all other maintenance medications as listed above.  Follow up plan: No follow-ups on file.  Educational handout given for [place] PA-C [place] [address]  [place], [place] [address] [phone]   04/23/2018, 9:57 AM

## 2018-04-23 NOTE — Patient Instructions (Signed)
In a few days you may receive a survey in the mail or online from [employer] regarding your visit with us today. Please take a moment to fill this out. Your feedback is very important to our whole office. It can help us better understand your needs as well as improve your experience and satisfaction. Thank you for taking your time to complete it. We care about you.  Gregor Dershem, PA-C  

## 2018-05-22 ENCOUNTER — Telehealth: Payer: 59 | Admitting: Family

## 2018-05-22 DIAGNOSIS — B9689 Other specified bacterial agents as the cause of diseases classified elsewhere: Secondary | ICD-10-CM | POA: Diagnosis not present

## 2018-05-22 DIAGNOSIS — J028 Acute pharyngitis due to other specified organisms: Secondary | ICD-10-CM

## 2018-05-22 MED ORDER — AMOXICILLIN-POT CLAVULANATE 875-125 MG PO TABS: 1.0000 | ORAL_TABLET | Freq: Two times a day (BID) | ORAL | 0 refills | Status: DC

## 2018-05-22 NOTE — Progress Notes (Signed)

## 2018-07-26 ENCOUNTER — Other Ambulatory Visit: Payer: Self-pay | Admitting: Physician Assistant

## 2018-07-26 DIAGNOSIS — F3342 Major depressive disorder, recurrent, in full remission: Secondary | ICD-10-CM

## 2018-11-01 ENCOUNTER — Other Ambulatory Visit: Payer: Self-pay | Admitting: Physician Assistant

## 2018-11-01 DIAGNOSIS — F3342 Major depressive disorder, recurrent, in full remission: Secondary | ICD-10-CM

## 2018-11-02 ENCOUNTER — Encounter: Payer: 59 | Admitting: Physician Assistant

## 2019-02-23 ENCOUNTER — Encounter: Payer: Self-pay | Admitting: Physician Assistant

## 2019-02-23 ENCOUNTER — Ambulatory Visit (INDEPENDENT_AMBULATORY_CARE_PROVIDER_SITE_OTHER): Payer: 59 | Admitting: Physician Assistant

## 2019-02-23 DIAGNOSIS — F3342 Major depressive disorder, recurrent, in full remission: Secondary | ICD-10-CM

## 2019-02-23 MED ORDER — FLUOXETINE HCL 20 MG PO CAPS: 20.0000 mg | ORAL_CAPSULE | Freq: Every day | ORAL | 3 refills | Status: DC

## 2019-02-23 MED FILL — FLUoxetine HCL 20 MG CAPS: 20 | 90 days supply | Qty: 90 | Fill #0

## 2019-02-23 NOTE — Progress Notes (Signed)
    Telephone visit  Subjective: CC: Depression PCP: Terald Sleeper, PA-C [id number] Amy Jackson is a 33 y.o. female calls for telephone consult today. Patient provides verbal consent for consult held via phone.  Patient is identified with 2 separate identifiers.  At this time the entire area is on COVID-19 social distancing and stay home orders are in place.  Patient is of higher risk and therefore we are performing this by a virtual method.  Location of patient: Home Location of provider: HOME Others present for call: No  This patient is having a recheck for her chronic condition of depression.  She got off of her medication a few months ago.  She thought she could do okay without it.  However she is a respiratory therapist at [employer].  And is under some stress and and concern about exposure with COVID.  She also has small children at home that she is helping with distant learning.  She has become more irritable she states and just not feeling well.  She did very well with Prozac in the past and would like to have that medication refill.   ROS: Per HPI  Allergies  Allergen Reactions  . Dilaudid [Hydromorphone Hcl] Itching   Past Medical History:  Diagnosis Date  . Complication of anesthesia    spinal anes- unsuccessful for 1st CS  . Depression   . Family history of adverse reaction to anesthesia    mother - difficult intubation  . Kidney infection 04/2015    Current Outpatient Medications:  .  amoxicillin-clavulanate (AUGMENTIN) 875-125 MG tablet, Take 1 tablet by mouth 2 (two) times daily., Disp: 14 tablet, Rfl: 0 .  FLUoxetine (PROZAC) 20 MG capsule, TAKE 1 CAPSULE (20 MG TOTAL) BY MOUTH DAILY. (NEEDS TO BE SEEN BEFORE NEXT REFILL), Disp: 30 capsule, Rfl: 0 .  levonorgestrel-ethinyl estradiol (PORTIA-28) 0.15-30 MG-MCG tablet, Take 1 tablet by mouth daily. Reported on 05/10/2015, Disp: , Rfl:   Assessment/ Plan: 33 y.o. female   1. Recurrent major depressive  disorder, in full remission ([name]) - FLUoxetine (PROZAC) 20 MG capsule; Take 1 capsule (20 mg total) by mouth daily.  Dispense: 90 capsule; Refill: 3   No follow-ups on file.  Continue all other maintenance medications as listed above.  Start time: 12:11 PM End time: 12:17 PM  No orders of the defined types were placed in this encounter.   Particia Nearing PA-C [place] [phone]

## 2019-03-09 ENCOUNTER — Encounter: Payer: Self-pay | Admitting: Family

## 2019-03-09 ENCOUNTER — Ambulatory Visit (INDEPENDENT_AMBULATORY_CARE_PROVIDER_SITE_OTHER): Payer: 59 | Admitting: Family

## 2019-03-09 ENCOUNTER — Other Ambulatory Visit: Payer: Self-pay

## 2019-03-09 DIAGNOSIS — H65191 Other acute nonsuppurative otitis media, right ear: Secondary | ICD-10-CM

## 2019-03-09 MED ORDER — AMOXICILLIN 875 MG PO TABS: 875.0000 mg | ORAL_TABLET | Freq: Two times a day (BID) | ORAL | 0 refills | Status: DC

## 2019-03-09 MED FILL — AMOXICILLIN 875 MG TABS: 875 | 7 days supply | Qty: 14 | Fill #0

## 2019-03-09 NOTE — Progress Notes (Signed)
   Virtual Visit via telephone Note Due to COVID-19 pandemic this visit was conducted virtually. This visit type was conducted due to national recommendations for restrictions regarding the COVID-19 Pandemic (e.g. social distancing, sheltering in place) in an effort to limit this patient's exposure and mitigate transmission in our community. All issues noted in this document were discussed and addressed.  A physical exam was not performed with this format.  I connected with Amy Jackson on 03/09/19 at 9:25 AM by telephone and verified that I am speaking with the correct person using two identifiers. Amy Jackson is currently located at work and no one is currently with her during visit. The provider, Evelina Dun, FNP is located in their office at time of visit.  I discussed the limitations, risks, security and privacy concerns of performing an evaluation and management service by telephone and the availability of in person appointments. I also discussed with the patient that there may be a patient responsible charge related to this service. The patient expressed understanding and agreed to proceed.   History and Present Illness:  Otalgia  There is pain in the right ear. This is a new problem. The current episode started in the past 7 days (5-7 days). The problem occurs every few minutes. The problem has been gradually worsening. There has been no fever. The pain is at a severity of 6/10. The pain is mild. Associated symptoms include hearing loss and rhinorrhea. Pertinent negatives include no coughing, ear discharge, headaches or sore throat. She has tried acetaminophen and NSAIDs for the symptoms. The treatment provided mild relief.      Review of Systems  HENT: Positive for ear pain, hearing loss and rhinorrhea. Negative for ear discharge and sore throat.   Respiratory: Negative for cough.   Neurological: Negative for headaches.  All other systems reviewed and are negative.     Observations/Objective: No SOB or distress noted  Assessment and Plan: 1. Other acute nonsuppurative otitis media of right ear, recurrence not specified Tylenol as needed Continue debrox OTC Force fluids Call if symptoms worsen or do not improve  - Ambulatory referral to ENT - amoxicillin (AMOXIL) 875 MG tablet; Take 1 tablet (875 mg total) by mouth 2 (two) times daily.  Dispense: 14 tablet; Refill: 0       I discussed the assessment and treatment plan with the patient. The patient was provided an opportunity to ask questions and all were answered. The patient agreed with the plan and demonstrated an understanding of the instructions.   The patient was advised to call back or seek an in-person evaluation if the symptoms worsen or if the condition fails to improve as anticipated.  The above assessment and management plan was discussed with the patient. The patient verbalized understanding of and has agreed to the management plan. Patient is aware to call the clinic if symptoms persist or worsen. Patient is aware when to return to the clinic for a follow-up visit. Patient educated on when it is appropriate to go to the emergency department.   Time call ended: 9:37 AM    I provided 12 minutes of non-face-to-face time during this encounter.    Evelina Dun, FNP

## 2019-03-28 ENCOUNTER — Ambulatory Visit: Payer: 59 | Admitting: Family

## 2019-06-23 DIAGNOSIS — Z01419 Encounter for gynecological examination (general) (routine) without abnormal findings: Secondary | ICD-10-CM | POA: Diagnosis not present

## 2019-10-07 ENCOUNTER — Encounter: Payer: 59 | Admitting: Nurse Practitioner

## 2019-12-21 MED FILL — FLUoxetine HCL 20 MG CAPS: 20 | 90 days supply | Qty: 90 | Fill #1

## 2020-07-03 ENCOUNTER — Other Ambulatory Visit (HOSPITAL_COMMUNITY): Payer: Self-pay | Admitting: Obstetrics and Gynecology

## 2020-07-04 MED FILL — ALPRAZolam 0.5 MG TABS: 0.5 | 10 days supply | Qty: 30 | Fill #0

## 2020-07-05 ENCOUNTER — Other Ambulatory Visit: Payer: Self-pay | Admitting: *Deleted

## 2020-07-05 DIAGNOSIS — F3342 Major depressive disorder, recurrent, in full remission: Secondary | ICD-10-CM

## 2020-07-11 ENCOUNTER — Ambulatory Visit: Payer: 59 | Admitting: Family Medicine

## 2020-09-12 ENCOUNTER — Other Ambulatory Visit: Payer: Self-pay

## 2020-09-12 ENCOUNTER — Other Ambulatory Visit (HOSPITAL_COMMUNITY): Payer: Self-pay

## 2020-09-12 ENCOUNTER — Encounter: Payer: Self-pay | Admitting: Family Medicine

## 2020-09-12 ENCOUNTER — Ambulatory Visit (INDEPENDENT_AMBULATORY_CARE_PROVIDER_SITE_OTHER): Payer: No Typology Code available for payment source | Admitting: Family Medicine

## 2020-09-12 DIAGNOSIS — F339 Major depressive disorder, recurrent, unspecified: Secondary | ICD-10-CM | POA: Diagnosis not present

## 2020-09-12 MED ORDER — FLUOXETINE HCL 20 MG PO CAPS
20.0000 mg | ORAL_CAPSULE | Freq: Every day | ORAL | 0 refills | Status: DC
  Filled 2020-09-12: qty 90, 90d supply, fill #0

## 2020-09-12 NOTE — Progress Notes (Signed)
Established Patient Office Visit  Subjective:  Patient ID: Amy Jackson, female    DOB: 02-16-1986  Age: 35 y.o. MRN: [id number]  CC:  Chief Complaint  Patient presents with  . Depression    HPI Amy Jackson presents for depression. She has been out of her Prozac for about 2 months. She has recently had two deaths in her close family. She saw her Ob/Gyn and was given a short term Rx for xanax. She would like to restart on her Prozac.   Depression screen [facility] 2/9 09/12/2020 04/23/2018 07/14/2017  Decreased Interest 0 0 0  Down, Depressed, Hopeless 0 0 0  PHQ - 2 Score 0 0 0  Altered sleeping 0 - -  Tired, decreased energy 3 - -  Change in appetite 1 - -  Feeling bad or failure about yourself  0 - -  Trouble concentrating 0 - -  Moving slowly or fidgety/restless 0 - -  Suicidal thoughts 0 - -  PHQ-9 Score 4 - -  Difficult doing work/chores Not difficult at all - -   GAD 7 : Generalized Anxiety Score 09/12/2020  Nervous, Anxious, on Edge 0  Control/stop worrying 0  Worry too much - different things 0  Trouble relaxing 0  Restless 0  Easily annoyed or irritable 2  Afraid - awful might happen 0  Total GAD 7 Score 2  Anxiety Difficulty Not difficult at all      Past Medical History:  Diagnosis Date  . Complication of anesthesia    spinal anes- unsuccessful for 1st CS  . Depression   . Family history of adverse reaction to anesthesia    mother - difficult intubation  . Kidney infection 04/2015    Past Surgical History:  Procedure Laterality Date  . CESAREAN SECTION     x1  . CESAREAN SECTION N/A 02/16/2015   Procedure: REPEAT CESAREAN SECTION;  Surgeon: Waynard Reeds, MD;  Location: [facility];  Service: Obstetrics;  Laterality: N/A;  . DILATION AND CURETTAGE OF UTERUS    . MOUTH SURGERY      Family History  Problem Relation Age of Onset  . Diabetes Mother   . Hypertension Mother   . Hyperlipidemia Father   . Hypertension Father   . Cancer Maternal Grandmother    . Diabetes Maternal Grandmother   . Heart disease Paternal Grandmother        MI    Social History   Socioeconomic History  . Marital status: Married    Spouse name: Not on file  . Number of children: Not on file  . Years of education: Not on file  . Highest education level: Not on file  Occupational History  . Not on file  Tobacco Use  . Smoking status: Never Smoker  . Smokeless tobacco: Never Used  Vaping Use  . Vaping Use: Never used  Substance and Sexual Activity  . Alcohol use: No  . Drug use: No  . Sexual activity: Yes    Birth control/protection: Pill  Other Topics Concern  . Not on file  Social History Narrative  . Not on file   Social Determinants of Health   Financial Resource Strain: Not on file  Food Insecurity: Not on file  Transportation Needs: Not on file  Physical Activity: Not on file  Stress: Not on file  Social Connections: Not on file  Intimate Partner Violence: Not on file    Outpatient Medications Prior to Visit  Medication Sig Dispense Refill  .  ALPRAZolam (XANAX) 0.5 MG tablet TAKE 1 TABLET BY MOUTH 3 TIMES DAILY AS NEEDED 30 tablet 3  . FLUoxetine (PROZAC) 20 MG capsule Take 1 capsule (20 mg total) by mouth daily. 90 capsule 3  . levonorgestrel-ethinyl estradiol (NORDETTE) 0.15-30 MG-MCG tablet Take 1 tablet by mouth daily. Reported on 05/10/2015    . amoxicillin (AMOXIL) 875 MG tablet Take 1 tablet (875 mg total) by mouth 2 (two) times daily. 14 tablet 0   No facility-administered medications prior to visit.    Allergies  Allergen Reactions  . Dilaudid [Hydromorphone Hcl] Itching    ROS Review of Systems As per HPI.    Objective:    Physical Exam Vitals and nursing note reviewed.  Constitutional:      General: She is not in acute distress.    Appearance: Normal appearance. She is not ill-appearing, toxic-appearing or diaphoretic.  Cardiovascular:     Rate and Rhythm: Normal rate and regular rhythm.     Heart sounds:  Normal heart sounds. No murmur heard.   Pulmonary:     Effort: Pulmonary effort is normal. No respiratory distress.     Breath sounds: Normal breath sounds.  Skin:    General: Skin is warm and dry.  Neurological:     General: No focal deficit present.     Mental Status: She is alert and oriented to person, place, and time.  Psychiatric:        Mood and Affect: Mood normal.        Behavior: Behavior normal.        Thought Content: Thought content normal.        Judgment: Judgment normal.     BP 124/80   Pulse 94   Temp 97.7 F (36.5 C) (Temporal)   Ht 5\' 5"  (1.651 m)   Wt 180 lb 6 oz (81.8 kg)   BMI 30.02 kg/m  Wt Readings from Last 3 Encounters:  09/12/20 180 lb 6 oz (81.8 kg)  04/23/18 170 lb (77.1 kg)  07/14/17 162 lb 12.8 oz (73.8 kg)     There are no preventive care reminders to display for this patient.  There are no preventive care reminders to display for this patient.  Lab Results  Component Value Date   TSH 1.900 07/14/2017   Lab Results  Component Value Date   WBC 8.7 07/14/2017   HGB 13.7 07/14/2017   HCT 40.8 07/14/2017   MCV 86 07/14/2017   PLT 210 07/14/2017   Lab Results  Component Value Date   NA 140 07/14/2017   K 4.3 07/14/2017   CO2 22 07/14/2017   GLUCOSE 89 07/14/2017   BUN 11 07/14/2017   CREATININE 0.67 07/14/2017   BILITOT 0.3 07/14/2017   ALKPHOS 106 07/14/2017   AST 14 07/14/2017   ALT 14 07/14/2017   PROT 7.4 07/14/2017   ALBUMIN 4.2 07/14/2017   CALCIUM 9.3 07/14/2017   ANIONGAP 7 04/27/2015   Lab Results  Component Value Date   CHOL 188 07/14/2017   Lab Results  Component Value Date   HDL 62 07/14/2017   Lab Results  Component Value Date   LDLCALC 103 (H) 07/14/2017   Lab Results  Component Value Date   TRIG 116 07/14/2017   Lab Results  Component Value Date   CHOLHDL 3.0 07/14/2017   Lab Results  Component Value Date   HGBA1C 5.0 07/14/2017      Assessment & Plan:   Amy Jackson was seen today for  depression.  Diagnoses and all orders for this visit:  Recurrent depression disorder (HCC) Restart prozac as below. No SI.  -     FLUoxetine (PROZAC) 20 MG capsule; Take 1 capsule (20 mg total) by mouth daily.   Follow-up: Return in about 6 weeks (around 10/24/2020) for CPE and depression.   The patient indicates understanding of these issues and agrees with the plan.    Gabriel Earing, FNP

## 2020-09-12 NOTE — Patient Instructions (Signed)

## 2020-09-19 ENCOUNTER — Other Ambulatory Visit (HOSPITAL_COMMUNITY): Payer: Self-pay

## 2020-10-22 ENCOUNTER — Other Ambulatory Visit (HOSPITAL_COMMUNITY): Payer: Self-pay

## 2020-10-22 MED FILL — Alprazolam Tab 0.5 MG: ORAL | 10 days supply | Qty: 30 | Fill #0 | Status: AC

## 2020-10-24 ENCOUNTER — Encounter: Payer: Self-pay | Admitting: Family Medicine

## 2020-10-24 ENCOUNTER — Other Ambulatory Visit: Payer: Self-pay

## 2020-10-24 ENCOUNTER — Other Ambulatory Visit (HOSPITAL_COMMUNITY): Payer: Self-pay

## 2020-10-24 ENCOUNTER — Ambulatory Visit (INDEPENDENT_AMBULATORY_CARE_PROVIDER_SITE_OTHER): Payer: No Typology Code available for payment source | Admitting: Family Medicine

## 2020-10-24 VITALS — BP 114/75 | HR 90 | Temp 97.4°F | Ht 65.0 in | Wt 180.5 lb

## 2020-10-24 DIAGNOSIS — Z0001 Encounter for general adult medical examination with abnormal findings: Secondary | ICD-10-CM | POA: Diagnosis not present

## 2020-10-24 DIAGNOSIS — Z0000 Encounter for general adult medical examination without abnormal findings: Secondary | ICD-10-CM

## 2020-10-24 DIAGNOSIS — F339 Major depressive disorder, recurrent, unspecified: Secondary | ICD-10-CM | POA: Diagnosis not present

## 2020-10-24 DIAGNOSIS — Z8632 Personal history of gestational diabetes: Secondary | ICD-10-CM | POA: Diagnosis not present

## 2020-10-24 LAB — BAYER DCA HB A1C WAIVED: HB A1C (BAYER DCA - WAIVED): 5.2 % (ref <7.0)

## 2020-10-24 MED ORDER — FLUOXETINE HCL 20 MG PO CAPS
20.0000 mg | ORAL_CAPSULE | Freq: Every day | ORAL | 2 refills | Status: DC
  Filled 2020-10-24 – 2021-07-11 (×2): qty 90, 90d supply, fill #0

## 2020-10-24 NOTE — Patient Instructions (Signed)
Health Maintenance, Female Adopting a healthy lifestyle and getting preventive care are important in promoting health and wellness. Ask your health care provider about:  The right schedule for you to have regular tests and exams.  Things you can do on your own to prevent diseases and keep yourself healthy. What should I know about diet, weight, and exercise? Eat a healthy diet  Eat a diet that includes plenty of vegetables, fruits, low-fat dairy products, and lean protein.  Do not eat a lot of foods that are high in solid fats, added sugars, or sodium.   Maintain a healthy weight Body mass index (BMI) is used to identify weight problems. It estimates body fat based on height and weight. Your health care provider can help determine your BMI and help you achieve or maintain a healthy weight. Get regular exercise Get regular exercise. This is one of the most important things you can do for your health. Most adults should:  Exercise for at least 150 minutes each week. The exercise should increase your heart rate and make you sweat (moderate-intensity exercise).  Do strengthening exercises at least twice a week. This is in addition to the moderate-intensity exercise.  Spend less time sitting. Even light physical activity can be beneficial. Watch cholesterol and blood lipids Have your blood tested for lipids and cholesterol at 35 years of age, then have this test every 5 years. Have your cholesterol levels checked more often if:  Your lipid or cholesterol levels are high.  You are older than 35 years of age.  You are at high risk for heart disease. What should I know about cancer screening? Depending on your health history and family history, you may need to have cancer screening at various ages. This may include screening for:  Breast cancer.  Cervical cancer.  Colorectal cancer.  Skin cancer.  Lung cancer. What should I know about heart disease, diabetes, and high blood  pressure? Blood pressure and heart disease  High blood pressure causes heart disease and increases the risk of stroke. This is more likely to develop in people who have high blood pressure readings, are of African descent, or are overweight.  Have your blood pressure checked: ? Every 3-5 years if you are 18-39 years of age. ? Every year if you are 40 years old or older. Diabetes Have regular diabetes screenings. This checks your fasting blood sugar level. Have the screening done:  Once every three years after age 40 if you are at a normal weight and have a low risk for diabetes.  More often and at a younger age if you are overweight or have a high risk for diabetes. What should I know about preventing infection? Hepatitis B If you have a higher risk for hepatitis B, you should be screened for this virus. Talk with your health care provider to find out if you are at risk for hepatitis B infection. Hepatitis C Testing is recommended for:  Everyone born from 1945 through 1965.  Anyone with known risk factors for hepatitis C. Sexually transmitted infections (STIs)  Get screened for STIs, including gonorrhea and chlamydia, if: ? You are sexually active and are younger than 35 years of age. ? You are older than 35 years of age and your health care provider tells you that you are at risk for this type of infection. ? Your sexual activity has changed since you were last screened, and you are at increased risk for chlamydia or gonorrhea. Ask your health care provider   if you are at risk.  Ask your health care provider about whether you are at high risk for HIV. Your health care provider may recommend a prescription medicine to help prevent HIV infection. If you choose to take medicine to prevent HIV, you should first get tested for HIV. You should then be tested every 3 months for as long as you are taking the medicine. Pregnancy  If you are about to stop having your period (premenopausal) and  you may become pregnant, seek counseling before you get pregnant.  Take 400 to 800 micrograms (mcg) of folic acid every day if you become pregnant.  Ask for birth control (contraception) if you want to prevent pregnancy. Osteoporosis and menopause Osteoporosis is a disease in which the bones lose minerals and strength with aging. This can result in bone fractures. If you are 65 years old or older, or if you are at risk for osteoporosis and fractures, ask your health care provider if you should:  Be screened for bone loss.  Take a calcium or vitamin D supplement to lower your risk of fractures.  Be given hormone replacement therapy (HRT) to treat symptoms of menopause. Follow these instructions at home: Lifestyle  Do not use any products that contain nicotine or tobacco, such as cigarettes, e-cigarettes, and chewing tobacco. If you need help quitting, ask your health care provider.  Do not use street drugs.  Do not share needles.  Ask your health care provider for help if you need support or information about quitting drugs. Alcohol use  Do not drink alcohol if: ? Your health care provider tells you not to drink. ? You are pregnant, may be pregnant, or are planning to become pregnant.  If you drink alcohol: ? Limit how much you use to 0-1 drink a day. ? Limit intake if you are breastfeeding.  Be aware of how much alcohol is in your drink. In the [place] one drink equals one 12 oz bottle of beer (355 mL), one 5 oz glass of wine (148 mL), or one 1 oz glass of hard liquor (44 mL). General instructions  Schedule regular health, dental, and eye exams.  Stay current with your vaccines.  Tell your health care provider if: ? You often feel depressed. ? You have ever been abused or do not feel safe at home. Summary  Adopting a healthy lifestyle and getting preventive care are important in promoting health and wellness.  Follow your health care provider's instructions about healthy  diet, exercising, and getting tested or screened for diseases.  Follow your health care provider's instructions on monitoring your cholesterol and blood pressure. This information is not intended to replace advice given to you by your health care provider. Make sure you discuss any questions you have with your health care provider. Document Revised: 04/28/2018 Document Reviewed: 04/28/2018 Elsevier Patient Education  2021 Elsevier Inc.  

## 2020-10-24 NOTE — Progress Notes (Signed)
Amy Jackson is a 35 y.o. female presents to office today for annual physical exam examination.    Concerns today include: 1. none  Occupation: RT, Marital status: married, Substance use: no Diet: regular, Exercise: occasionally Last pap smear: 2019, sees GYN yearly  Depression screen [facility] 2/9 10/24/2020 09/12/2020 04/23/2018  Decreased Interest 0 0 0  Down, Depressed, Hopeless 0 0 0  PHQ - 2 Score 0 0 0  Altered sleeping 0 0 -  Tired, decreased energy 0 3 -  Change in appetite 0 1 -  Feeling bad or failure about yourself  0 0 -  Trouble concentrating 0 0 -  Moving slowly or fidgety/restless 0 0 -  Suicidal thoughts 0 0 -  PHQ-9 Score 0 4 -  Difficult doing work/chores Not difficult at all Not difficult at all -     Past Medical History:  Diagnosis Date  . Complication of anesthesia    spinal anes- unsuccessful for 1st CS  . Depression   . Family history of adverse reaction to anesthesia    mother - difficult intubation  . Kidney infection 04/2015   Social History   Socioeconomic History  . Marital status: Married    Spouse name: Not on file  . Number of children: Not on file  . Years of education: Not on file  . Highest education level: Not on file  Occupational History  . Not on file  Tobacco Use  . Smoking status: Never Smoker  . Smokeless tobacco: Never Used  Vaping Use  . Vaping Use: Never used  Substance and Sexual Activity  . Alcohol use: No  . Drug use: No  . Sexual activity: Yes    Birth control/protection: Pill  Other Topics Concern  . Not on file  Social History Narrative  . Not on file   Social Determinants of Health   Financial Resource Strain: Not on file  Food Insecurity: Not on file  Transportation Needs: Not on file  Physical Activity: Not on file  Stress: Not on file  Social Connections: Not on file  Intimate Partner Violence: Not on file   Past Surgical History:  Procedure Laterality Date  . CESAREAN SECTION     x1  .  CESAREAN SECTION N/A 02/16/2015   Procedure: REPEAT CESAREAN SECTION;  Surgeon: Vanessa Kick, MD;  Location: [facility];  Service: Obstetrics;  Laterality: N/A;  . DILATION AND CURETTAGE OF UTERUS    . MOUTH SURGERY     Family History  Problem Relation Age of Onset  . Diabetes Mother   . Hypertension Mother   . Hyperlipidemia Father   . Hypertension Father   . Cancer Maternal Grandmother   . Diabetes Maternal Grandmother   . Heart disease Paternal Grandmother        MI    Current Outpatient Medications:  .  ALPRAZolam (XANAX) 0.5 MG tablet, TAKE 1 TABLET BY MOUTH 3 TIMES DAILY AS NEEDED, Disp: 30 tablet, Rfl: 3 .  FLUoxetine (PROZAC) 20 MG capsule, Take 1 capsule (20 mg total) by mouth daily., Disp: 90 capsule, Rfl: 0 .  levonorgestrel-ethinyl estradiol (NORDETTE) 0.15-30 MG-MCG tablet, Take 1 tablet by mouth daily. Reported on 05/10/2015, Disp: , Rfl:   Allergies  Allergen Reactions  . Dilaudid [Hydromorphone Hcl] Itching     ROS: Review of Systems Pertinent items noted in HPI and remainder of comprehensive ROS otherwise negative.    Physical exam BP 114/75   Pulse 90   Temp (!) 97.4  F (36.3 C) (Oral)   Ht 5' 5"  (1.651 m)   Wt 180 lb 8 oz (81.9 kg)   BMI 30.04 kg/m  General appearance: alert, cooperative and no distress Head: Normocephalic, without obvious abnormality, atraumatic Eyes: conjunctivae/corneas clear. PERRL, EOM's intact. Fundi benign. Ears: normal TM's and external ear canals both ears Nose: Nares normal. Septum midline. Mucosa normal. No drainage or sinus tenderness. Throat: lips, mucosa, and tongue normal; teeth and gums normal Neck: no adenopathy, no carotid bruit, no JVD, supple, symmetrical, trachea midline and thyroid not enlarged, symmetric, no tenderness/mass/nodules Lungs: clear to auscultation bilaterally Breasts: normal appearance, no masses or tenderness Heart: regular rate and rhythm, S1, S2 normal, no murmur, click, rub or gallop Abdomen: soft,  non-tender; bowel sounds normal; no masses,  no organomegaly Extremities: extremities normal, atraumatic, no cyanosis or edema Skin: Skin color, texture, turgor normal. No rashes or lesions Lymph nodes: Cervical, supraclavicular, and axillary nodes normal. Neurologic: Alert and oriented X 3, normal strength and tone. Normal symmetric reflexes. Normal coordination and gait    Assessment/ Plan: Amy Jackson here for annual physical exam.   Amy Jackson was seen today for annual exam.  Diagnoses and all orders for this visit:  Routine general medical examination at a health care facility Labs pending as below.  -     CBC with Differential/Platelet -     CMP14+EGFR -     TSH -     Vitamin B12 -     VITAMIN D 25 Hydroxy (Vit-D Deficiency, Fractures)  Recurrent depression (HCC) Well controlled on current regimen.  -     FLUoxetine (PROZAC) 20 MG capsule; Take 1 capsule (20 mg total) by mouth daily.  History of gestational diabetes Labs pending.  -     Bayer DCA Hb A1c Waived   Counseled on healthy lifestyle choices, including diet (rich in fruits, vegetables and lean meats and low in salt and simple carbohydrates) and exercise (at least 30 minutes of moderate physical activity daily).  Return in about 6 months (around 04/25/2021) for medication follow up.   The above assessment and management plan was discussed with the patient. The patient verbalized understanding of and has agreed to the management plan. Patient is aware to call the clinic if symptoms persist or worsen. Patient is aware when to return to the clinic for a follow-up visit. Patient educated on when it is appropriate to go to the emergency department.   Amy Smolder, FNP-C [facility] [address] [place], [place] [address] [phone]

## 2020-10-25 ENCOUNTER — Encounter: Payer: Self-pay | Admitting: Family Medicine

## 2020-10-25 ENCOUNTER — Other Ambulatory Visit: Payer: Self-pay | Admitting: Family Medicine

## 2020-10-25 DIAGNOSIS — E559 Vitamin D deficiency, unspecified: Secondary | ICD-10-CM

## 2020-10-25 LAB — CMP14+EGFR
ALT: 13 IU/L (ref 0–32)
AST: 14 IU/L (ref 0–40)
Albumin/Globulin Ratio: 1.7 (ref 1.2–2.2)
Albumin: 4.5 g/dL (ref 3.8–4.8)
Alkaline Phosphatase: 118 IU/L (ref 44–121)
BUN/Creatinine Ratio: 13 (ref 9–23)
BUN: 7 mg/dL (ref 6–20)
Bilirubin Total: 0.3 mg/dL (ref 0.0–1.2)
CO2: 19 mmol/L — ABNORMAL LOW (ref 20–29)
Calcium: 9.2 mg/dL (ref 8.7–10.2)
Chloride: 101 mmol/L (ref 96–106)
Creatinine, Ser: 0.52 mg/dL — ABNORMAL LOW (ref 0.57–1.00)
Globulin, Total: 2.6 g/dL (ref 1.5–4.5)
Glucose: 85 mg/dL (ref 65–99)
Potassium: 4.3 mmol/L (ref 3.5–5.2)
Sodium: 138 mmol/L (ref 134–144)
Total Protein: 7.1 g/dL (ref 6.0–8.5)
eGFR: 124 mL/min/{1.73_m2} (ref >59)

## 2020-10-25 LAB — CBC WITH DIFFERENTIAL/PLATELET
Basophils Absolute: 0.1 10*3/uL (ref 0.0–0.2)
Basos: 1 %
EOS (ABSOLUTE): 0.1 10*3/uL (ref 0.0–0.4)
Eos: 2 %
Hematocrit: 41.5 % (ref 34.0–46.6)
Hemoglobin: 13.7 g/dL (ref 11.1–15.9)
Immature Grans (Abs): 0 10*3/uL (ref 0.0–0.1)
Immature Granulocytes: 0 %
Lymphocytes Absolute: 2.4 10*3/uL (ref 0.7–3.1)
Lymphs: 29 %
MCH: 28.7 pg (ref 26.6–33.0)
MCHC: 33 g/dL (ref 31.5–35.7)
MCV: 87 fL (ref 79–97)
Monocytes Absolute: 0.4 10*3/uL (ref 0.1–0.9)
Monocytes: 5 %
Neutrophils Absolute: 5.2 10*3/uL (ref 1.4–7.0)
Neutrophils: 63 %
Platelets: 246 10*3/uL (ref 150–450)
RBC: 4.77 x10E6/uL (ref 3.77–5.28)
RDW: 12.7 % (ref 11.7–15.4)
WBC: 8.2 10*3/uL (ref 3.4–10.8)

## 2020-10-25 LAB — VITAMIN D 25 HYDROXY (VIT D DEFICIENCY, FRACTURES): Vit D, 25-Hydroxy: 28.6 ng/mL — ABNORMAL LOW (ref 30.0–100.0)

## 2020-10-25 LAB — TSH: TSH: 1.54 u[IU]/mL (ref 0.450–4.500)

## 2020-10-25 LAB — VITAMIN B12: Vitamin B-12: 238 pg/mL (ref 232–1245)

## 2020-10-25 MED ORDER — VITAMIN D (ERGOCALCIFEROL) 1.25 MG (50000 UNIT) PO CAPS: 50000.0000 [IU] | ORAL_CAPSULE | ORAL | 0 refills | Status: DC

## 2020-10-25 NOTE — Telephone Encounter (Signed)
OKAY TO ADDED ?

## 2020-10-30 LAB — LIPID PANEL
Chol/HDL Ratio: 3.1 ratio (ref 0.0–4.4)
Cholesterol, Total: 231 mg/dL — ABNORMAL HIGH (ref 100–199)
HDL: 74 mg/dL (ref >39)
LDL Chol Calc (NIH): 133 mg/dL — ABNORMAL HIGH (ref 0–99)
Triglycerides: 137 mg/dL (ref 0–149)
VLDL Cholesterol Cal: 24 mg/dL (ref 5–40)

## 2020-10-30 LAB — SPECIMEN STATUS REPORT

## 2020-12-16 ENCOUNTER — Other Ambulatory Visit: Payer: Self-pay | Admitting: Family Medicine

## 2020-12-16 DIAGNOSIS — E559 Vitamin D deficiency, unspecified: Secondary | ICD-10-CM

## 2021-01-08 ENCOUNTER — Other Ambulatory Visit: Payer: Self-pay | Admitting: Family Medicine

## 2021-01-08 DIAGNOSIS — E559 Vitamin D deficiency, unspecified: Secondary | ICD-10-CM

## 2021-02-07 ENCOUNTER — Other Ambulatory Visit (HOSPITAL_BASED_OUTPATIENT_CLINIC_OR_DEPARTMENT_OTHER): Payer: Self-pay

## 2021-02-07 MED ORDER — INFLUENZA VAC SPLIT QUAD 0.5 ML IM SUSY
PREFILLED_SYRINGE | INTRAMUSCULAR | 0 refills | Status: DC
  Filled 2021-02-07: qty 0.5, 1d supply, fill #0

## 2021-04-25 ENCOUNTER — Ambulatory Visit: Payer: No Typology Code available for payment source | Admitting: Family Medicine

## 2021-07-11 ENCOUNTER — Other Ambulatory Visit: Payer: Self-pay | Admitting: Family Medicine

## 2021-07-11 ENCOUNTER — Other Ambulatory Visit (HOSPITAL_COMMUNITY): Payer: Self-pay

## 2021-07-12 ENCOUNTER — Other Ambulatory Visit (HOSPITAL_COMMUNITY): Payer: Self-pay

## 2021-11-14 ENCOUNTER — Encounter: Payer: Self-pay | Admitting: Family Medicine

## 2021-11-14 ENCOUNTER — Ambulatory Visit (INDEPENDENT_AMBULATORY_CARE_PROVIDER_SITE_OTHER): Payer: No Typology Code available for payment source | Admitting: Family Medicine

## 2021-11-14 ENCOUNTER — Other Ambulatory Visit (HOSPITAL_COMMUNITY): Payer: Self-pay

## 2021-11-14 ENCOUNTER — Other Ambulatory Visit: Payer: Self-pay | Admitting: Family Medicine

## 2021-11-14 VITALS — BP 111/69 | HR 87 | Temp 98.2°F | Ht 65.0 in | Wt 189.0 lb

## 2021-11-14 DIAGNOSIS — E669 Obesity, unspecified: Secondary | ICD-10-CM | POA: Diagnosis not present

## 2021-11-14 DIAGNOSIS — Z0000 Encounter for general adult medical examination without abnormal findings: Secondary | ICD-10-CM

## 2021-11-14 DIAGNOSIS — R635 Abnormal weight gain: Secondary | ICD-10-CM

## 2021-11-14 DIAGNOSIS — E559 Vitamin D deficiency, unspecified: Secondary | ICD-10-CM

## 2021-11-14 DIAGNOSIS — Z8632 Personal history of gestational diabetes: Secondary | ICD-10-CM | POA: Diagnosis not present

## 2021-11-14 LAB — BAYER DCA HB A1C WAIVED: HB A1C (BAYER DCA - WAIVED): 4.9 % (ref 4.8–5.6)

## 2021-11-14 MED ORDER — SEMAGLUTIDE-WEIGHT MANAGEMENT 1 MG/0.5ML ~~LOC~~ SOAJ
1.0000 mg | SUBCUTANEOUS | 0 refills | Status: DC
  Filled 2021-11-14: qty 2, 28d supply, fill #0

## 2021-11-14 MED ORDER — SEMAGLUTIDE-WEIGHT MANAGEMENT 1.7 MG/0.75ML ~~LOC~~ SOAJ
1.7000 mg | SUBCUTANEOUS | 0 refills | Status: DC
  Filled 2021-11-14: qty 3, 28d supply, fill #0

## 2021-11-14 MED ORDER — INSULIN PEN NEEDLE 32G X 6 MM MISC: 1.0000 | Freq: Every day | 2 refills | Status: DC

## 2021-11-14 MED ORDER — SEMAGLUTIDE-WEIGHT MANAGEMENT 0.25 MG/0.5ML ~~LOC~~ SOAJ
0.2500 mg | SUBCUTANEOUS | 0 refills | Status: DC
  Filled 2021-11-14: qty 2, 28d supply, fill #0

## 2021-11-14 MED ORDER — SEMAGLUTIDE-WEIGHT MANAGEMENT 0.5 MG/0.5ML ~~LOC~~ SOAJ
0.5000 mg | SUBCUTANEOUS | 0 refills | Status: DC
  Filled 2021-11-14: qty 2, 28d supply, fill #0

## 2021-11-14 MED ORDER — SAXENDA 18 MG/3ML ~~LOC~~ SOPN: PEN_INJECTOR | SUBCUTANEOUS | 3 refills | Status: DC

## 2021-11-14 MED ORDER — SEMAGLUTIDE-WEIGHT MANAGEMENT 2.4 MG/0.75ML ~~LOC~~ SOAJ
2.4000 mg | SUBCUTANEOUS | 1 refills | Status: DC
  Filled 2021-11-14: qty 3, fill #0

## 2021-11-14 NOTE — Progress Notes (Signed)
Complete physical exam  Patient: Amy Jackson   DOB: 1985-06-26   36 y.o. Female  MRN: [id number]  Subjective:    Chief Complaint  Patient presents with   Annual Exam    Amy Jackson is a 36 y.o. female who presents today for a complete physical exam. She reports consuming a general diet. Home exercise routine includes walking several hrs per days when at work. She generally feels fairly well. She reports sleeping well. She does have additional problems to discuss today.   She would like to discuss weight loss medications today. She feels like her diet is good overall. She struggles with exercise due to time constraints but she is active at work for long shifts multiple times a week. She has not tried weight loss medications in the past.    Most recent fall risk assessment:    11/14/2021    9:21 AM  [place] in the past year? 0     Most recent depression screenings:    11/14/2021    9:21 AM 10/24/2020   10:33 AM  PHQ 2/9 Scores  PHQ - 2 Score 0 0  PHQ- 9 Score 2 0    Vision:Not within last year   Patient Active Problem List   Diagnosis Date Noted   Recurrent major depressive disorder, in full remission ([place]) 03/17/2016      Patient Care Team: Gwenlyn Perking, FNP as PCP - General (Family Medicine)   Outpatient Medications Prior to Visit  Medication Sig   FLUoxetine (PROZAC) 20 MG capsule Take 1 capsule (20 mg total) by mouth daily.   levonorgestrel-ethinyl estradiol (NORDETTE) 0.15-30 MG-MCG tablet Take 1 tablet by mouth daily. Reported on 05/10/2015-Gets From GYN   [DISCONTINUED] influenza vac split quadrivalent PF (FLUARIX) 0.5 ML injection Inject into the muscle.   [DISCONTINUED] Vitamin D, Ergocalciferol, (DRISDOL) 1.25 MG (50000 UNIT) CAPS capsule TAKE 1 CAPSULE (50,000 UNITS TOTAL) BY MOUTH EVERY 7 (SEVEN) DAYS   No facility-administered medications prior to visit.    Review of Systems  Constitutional:  Negative for chills, fever and weight  loss.  HENT:  Negative for ear discharge, hearing loss, nosebleeds and tinnitus.   Eyes:  Negative for blurred vision, double vision, photophobia and pain.  Respiratory:  Negative for cough and sputum production.   Cardiovascular:  Negative for chest pain, palpitations and claudication.  Gastrointestinal:  Negative for abdominal pain, heartburn and vomiting.  Genitourinary:  Negative for dysuria and urgency.  Musculoskeletal:  Negative for back pain, myalgias and neck pain.  Skin:  Negative for itching and rash.  Neurological:  Negative for dizziness, sensory change, speech change and headaches.  Endo/Heme/Allergies:  Negative for polydipsia. Does not bruise/bleed easily.  Psychiatric/Behavioral:  Negative for depression, hallucinations, substance abuse and suicidal ideas.           Objective:     BP 111/69   Pulse 87   Temp 98.2 F (36.8 C) (Temporal)   Ht 5' 5"  (1.651 m)   Wt 85.7 kg   SpO2 97%   BMI 31.45 kg/m  BP Readings from Last 3 Encounters:  11/14/21 111/69  10/24/20 114/75  09/12/20 124/80      Physical Exam Constitutional:      Appearance: Normal appearance. She is obese.  HENT:     Head: Normocephalic and atraumatic.     Right Ear: Tympanic membrane, ear canal and external ear normal.     Left Ear: Tympanic membrane, ear canal  and external ear normal.     Nose: Nose normal.     Mouth/Throat:     Mouth: Mucous membranes are moist.  Eyes:     Pupils: Pupils are equal, round, and reactive to light.  Cardiovascular:     Rate and Rhythm: Normal rate and regular rhythm.     Pulses: Normal pulses.     Heart sounds: Normal heart sounds.  Pulmonary:     Effort: Pulmonary effort is normal.     Breath sounds: Normal breath sounds.  Abdominal:     General: Bowel sounds are normal.     Palpations: Abdomen is soft.  Musculoskeletal:        General: Normal range of motion.     Cervical back: Normal range of motion and neck supple.  Skin:    General: Skin is  warm and dry.     Capillary Refill: Capillary refill takes less than 2 seconds.  Neurological:     Mental Status: She is alert and oriented to person, place, and time.  Psychiatric:        Mood and Affect: Mood normal.        Behavior: Behavior normal.        Thought Content: Thought content normal.        Judgment: Judgment normal.      No results found for any visits on 11/14/21. Last CBC Lab Results  Component Value Date   WBC 8.2 10/24/2020   HGB 13.7 10/24/2020   HCT 41.5 10/24/2020   MCV 87 10/24/2020   MCH 28.7 10/24/2020   RDW 12.7 10/24/2020   PLT 246 24/58/0998   Last metabolic panel Lab Results  Component Value Date   GLUCOSE 85 10/24/2020   NA 138 10/24/2020   K 4.3 10/24/2020   CL 101 10/24/2020   CO2 19 (L) 10/24/2020   BUN 7 10/24/2020   CREATININE 0.52 (L) 10/24/2020   EGFR 124 10/24/2020   CALCIUM 9.2 10/24/2020   PROT 7.1 10/24/2020   ALBUMIN 4.5 10/24/2020   LABGLOB 2.6 10/24/2020   AGRATIO 1.7 10/24/2020   BILITOT 0.3 10/24/2020   ALKPHOS 118 10/24/2020   AST 14 10/24/2020   ALT 13 10/24/2020   ANIONGAP 7 04/27/2015   Last lipids Lab Results  Component Value Date   CHOL 231 (H) 10/24/2020   HDL 74 10/24/2020   LDLCALC 133 (H) 10/24/2020   TRIG 137 10/24/2020   CHOLHDL 3.1 10/24/2020   Last hemoglobin A1c Lab Results  Component Value Date   HGBA1C 5.2 10/24/2020   Last vitamin B12 and Folate Lab Results  Component Value Date   VITAMINB12 238 10/24/2020        Assessment & Plan:    Routine Health Maintenance and Physical Exam  Amy Jackson was seen today for annual exam.  Diagnoses and all orders for this visit:  Routine general medical examination at a health care facility Fasting labs as below.  -     CBC with Differential/Platelet -     CMP14+EGFR -     Lipid panel -     Thyroid Panel With TSH -     Bayer DCA Hb A1c Waived  Vitamin D deficiency Not on repletion therapy.  -     VITAMIN D 25 Hydroxy (Vit-D Deficiency,  Fractures)  History of gestational diabetes -     Bayer DCA Hb A1c Waived  Obesity (BMI 30-39.9) Discussed wegovy and saxenda. [facility] ordered as below. Discussed if unable to obtain due  to lack of stock, can start saxenda and bridge over once she has titrated up. Discussed diet and exercise.  -     CBC with Differential/Platelet -     CMP14+EGFR -     Lipid panel -     Thyroid Panel With TSH -     Bayer DCA Hb A1c Waived -     Semaglutide-Weight Management 0.25 MG/0.5ML SOAJ; Inject 0.25 mg into the skin once a week for 28 days. -     Semaglutide-Weight Management 0.5 MG/0.5ML SOAJ; Inject 0.5 mg into the skin once a week for 28 days. -     Semaglutide-Weight Management 1 MG/0.5ML SOAJ; Inject 1 mg into the skin once a week for 28 days. -     Semaglutide-Weight Management 1.7 MG/0.75ML SOAJ; Inject 1.7 mg into the skin once a week for 28 days. -     Semaglutide-Weight Management 2.4 MG/0.75ML SOAJ; Inject 2.4 mg into the skin once a week for 28 days.   Discussed health benefits of physical activity, and encouraged her to engage in regular exercise appropriate for her age and condition.   Follow up in 4-6 weeks for weight follow up.    Kathlen Mody, RN, FNP student  I personally was present during the history, physical exam, and medical decision-making activities of this service and have verified that the service and findings are accurately documented in the nurse practitioner student's note.  Marjorie Smolder, FNP

## 2021-11-14 NOTE — Patient Instructions (Signed)

## 2021-11-15 ENCOUNTER — Other Ambulatory Visit: Payer: Self-pay | Admitting: Family Medicine

## 2021-11-15 DIAGNOSIS — E559 Vitamin D deficiency, unspecified: Secondary | ICD-10-CM

## 2021-11-15 LAB — CBC WITH DIFFERENTIAL/PLATELET
Basophils Absolute: 0.1 10*3/uL (ref 0.0–0.2)
Basos: 1 %
EOS (ABSOLUTE): 0.1 10*3/uL (ref 0.0–0.4)
Eos: 2 %
Hematocrit: 40.6 % (ref 34.0–46.6)
Hemoglobin: 13.8 g/dL (ref 11.1–15.9)
Immature Grans (Abs): 0 10*3/uL (ref 0.0–0.1)
Immature Granulocytes: 0 %
Lymphocytes Absolute: 2.3 10*3/uL (ref 0.7–3.1)
Lymphs: 29 %
MCH: 28.4 pg (ref 26.6–33.0)
MCHC: 34 g/dL (ref 31.5–35.7)
MCV: 84 fL (ref 79–97)
Monocytes Absolute: 0.4 10*3/uL (ref 0.1–0.9)
Monocytes: 4 %
Neutrophils Absolute: 5.2 10*3/uL (ref 1.4–7.0)
Neutrophils: 64 %
Platelets: 255 10*3/uL (ref 150–450)
RBC: 4.86 x10E6/uL (ref 3.77–5.28)
RDW: 12.1 % (ref 11.7–15.4)
WBC: 8.2 10*3/uL (ref 3.4–10.8)

## 2021-11-15 LAB — CMP14+EGFR
ALT: 18 IU/L (ref 0–32)
AST: 14 IU/L (ref 0–40)
Albumin/Globulin Ratio: 1.5 (ref 1.2–2.2)
Albumin: 4.3 g/dL (ref 3.8–4.8)
Alkaline Phosphatase: 125 IU/L — ABNORMAL HIGH (ref 44–121)
BUN/Creatinine Ratio: 17 (ref 9–23)
BUN: 10 mg/dL (ref 6–20)
Bilirubin Total: 0.5 mg/dL (ref 0.0–1.2)
CO2: 24 mmol/L (ref 20–29)
Calcium: 9.3 mg/dL (ref 8.7–10.2)
Chloride: 98 mmol/L (ref 96–106)
Creatinine, Ser: 0.58 mg/dL (ref 0.57–1.00)
Globulin, Total: 2.8 g/dL (ref 1.5–4.5)
Glucose: 96 mg/dL (ref 70–99)
Potassium: 4.5 mmol/L (ref 3.5–5.2)
Sodium: 138 mmol/L (ref 134–144)
Total Protein: 7.1 g/dL (ref 6.0–8.5)
eGFR: 120 mL/min/{1.73_m2} (ref >59)

## 2021-11-15 LAB — THYROID PANEL WITH TSH
Free Thyroxine Index: 1.6 (ref 1.2–4.9)
T3 Uptake Ratio: 21 % — ABNORMAL LOW (ref 24–39)
T4, Total: 7.4 ug/dL (ref 4.5–12.0)
TSH: 1.97 u[IU]/mL (ref 0.450–4.500)

## 2021-11-15 LAB — LIPID PANEL
Chol/HDL Ratio: 2.8 ratio (ref 0.0–4.4)
Cholesterol, Total: 162 mg/dL (ref 100–199)
HDL: 57 mg/dL
LDL Chol Calc (NIH): 83 mg/dL (ref 0–99)
Triglycerides: 123 mg/dL (ref 0–149)
VLDL Cholesterol Cal: 22 mg/dL (ref 5–40)

## 2021-11-15 LAB — VITAMIN D 25 HYDROXY (VIT D DEFICIENCY, FRACTURES): Vit D, 25-Hydroxy: 28.2 ng/mL — ABNORMAL LOW (ref 30.0–100.0)

## 2021-11-15 MED ORDER — VITAMIN D (ERGOCALCIFEROL) 1.25 MG (50000 UNIT) PO CAPS: 50000.0000 [IU] | ORAL_CAPSULE | ORAL | 0 refills | Status: DC

## 2021-11-18 ENCOUNTER — Other Ambulatory Visit (HOSPITAL_COMMUNITY): Payer: Self-pay

## 2021-11-18 ENCOUNTER — Telehealth: Payer: Self-pay

## 2021-11-18 ENCOUNTER — Telehealth: Payer: Self-pay | Admitting: Family Medicine

## 2021-11-18 DIAGNOSIS — E669 Obesity, unspecified: Secondary | ICD-10-CM

## 2021-11-18 MED ORDER — INSULIN PEN NEEDLE 32G X 6 MM MISC
1.0000 | Freq: Every day | 2 refills | Status: DC
  Filled 2021-11-18 – 2021-11-26 (×2): qty 100, 90d supply, fill #0

## 2021-11-18 MED ORDER — SAXENDA 18 MG/3ML ~~LOC~~ SOPN
PEN_INJECTOR | SUBCUTANEOUS | 3 refills | Status: DC
  Filled 2021-11-18: qty 15, 30d supply, fill #0
  Filled 2022-01-13: qty 15, 30d supply, fill #1
  Filled 2022-02-11: qty 15, 30d supply, fill #2

## 2021-11-18 NOTE — Telephone Encounter (Signed)
Amy Jackson (Key: [id number]) Rx #: [id number] Saxenda 18MG pen-injectors   Form MedImpact ePA Form 2017 NCPDP Created 4 days ago Sent to Plan 2 minutes ago Plan Response 2 minutes ago Submit Clinical Questions less than a minute ago Determination Wait for Determination Please wait for MedImpact 2017 to return a determination.

## 2021-11-18 NOTE — Telephone Encounter (Signed)
RX sent to correct pharmacy.   Attempted to contact patient, VM full

## 2021-11-21 ENCOUNTER — Other Ambulatory Visit (HOSPITAL_COMMUNITY): Payer: Self-pay

## 2021-11-21 NOTE — Telephone Encounter (Signed)
Amy Jackson [place] (Key: [id number]) Rx #: [id number] Saxenda 18MG pen-injectors   Form MedImpact ePA Form 2017 NCPDP Created 7 days ago Sent to Plan 3 days ago Plan Response 3 days ago Submit Clinical Questions 3 days ago Determination Favorable 18 hours ago Your prior authorization for 2018 has been approved! MORE INFO For eligible patients, copay assistance may be available. To learn more and be redirected to the [facility] website, click on the "More Info" button to the right. Please also note that you may need to schedule a follow-up visit with your patient prior to the expiration of this prior authorization, as updated patient weight may be required for reauthorization.  Message from plan: The request has been approved. The authorization is effective for a maximum of 4 fills from 11/20/2021 to 03/12/2022, as long as the member is enrolled in their current health plan. The request was approved as submitted. This request is approve for 59mL (5 pens) per 30 days.Please note: This medication must be filled and or managed by a [facility] pharmacy. Please call [phone]. A written notification letter will follow with additional details.  Pharmacy and patient informed.

## 2021-11-26 ENCOUNTER — Other Ambulatory Visit (HOSPITAL_COMMUNITY): Payer: Self-pay

## 2022-01-13 ENCOUNTER — Other Ambulatory Visit (HOSPITAL_COMMUNITY): Payer: Self-pay

## 2022-01-22 ENCOUNTER — Other Ambulatory Visit (HOSPITAL_COMMUNITY): Payer: Self-pay

## 2022-01-22 ENCOUNTER — Ambulatory Visit (INDEPENDENT_AMBULATORY_CARE_PROVIDER_SITE_OTHER): Payer: No Typology Code available for payment source | Admitting: Family Medicine

## 2022-01-22 ENCOUNTER — Encounter: Payer: Self-pay | Admitting: Family Medicine

## 2022-01-22 VITALS — BP 113/71 | HR 100 | Temp 98.7°F | Ht 65.0 in | Wt 177.0 lb

## 2022-01-22 DIAGNOSIS — R000 Tachycardia, unspecified: Secondary | ICD-10-CM | POA: Diagnosis not present

## 2022-01-22 DIAGNOSIS — E663 Overweight: Secondary | ICD-10-CM

## 2022-01-22 MED ORDER — METOPROLOL TARTRATE 25 MG PO TABS
12.5000 mg | ORAL_TABLET | Freq: Every day | ORAL | 1 refills | Status: DC
  Filled 2022-01-22: qty 45, 90d supply, fill #0

## 2022-01-22 NOTE — Patient Instructions (Signed)
Sinus Tachycardia  Sinus tachycardia is a kind of fast heartbeat. In sinus tachycardia, the heart beats more than 100 times a minute. Sinus tachycardia starts in a part of the heart called the sinus node. Sinus tachycardia may be harmless, or it may be a sign of a serious condition. What are the causes? This condition may be caused by: Exercise or exertion. A fever. Pain. Loss of body fluids (dehydration). Severe bleeding (hemorrhage). Anxiety and stress. Certain substances, including: Alcohol. Caffeine. Tobacco and nicotine products. Cold medicines. Illegal drugs. Medical conditions including: Heart disease. An infection. An overactive thyroid (hyperthyroidism). A lack of red blood cells (anemia). What are the signs or symptoms? Symptoms of this condition include: A feeling that the heart is beating quickly (palpitations). Suddenly noticing your heartbeat (cardiac awareness). Dizziness. Tiredness (fatigue). Shortness of breath. Chest pain. Nausea. Fainting. How is this diagnosed? This condition is diagnosed with: A physical exam. Other tests, such as: Blood tests. An electrocardiogram (ECG). This test measures the electrical activity of the heart. Ambulatory cardiac monitor. This records your heartbeats for 24 hours or more. You may be referred to a heart specialist (cardiologist). How is this treated? Treatment for this condition depends on the cause or the underlying condition. Treatment may involve: Treating the underlying condition. Taking new medicines or changing your current medicines as told by your health care provider. Making changes to your diet or lifestyle. Follow these instructions at home: Lifestyle  Do not use any products that contain nicotine or tobacco, such as cigarettes and e-cigarettes. If you need help quitting, ask your health care provider. Do not use illegal drugs, such as cocaine. Learn relaxation methods to help you when you get stressed  or anxious. These include deep breathing. Avoid caffeine or other stimulants. Alcohol use  Do not drink alcohol if: Your health care provider tells you not to drink. You are pregnant, may be pregnant, or are planning to become pregnant. If you drink alcohol, limit how much you have: 0-1 drink a day for women. 0-2 drinks a day for men. Be aware of how much alcohol is in your drink. In the [place] one drink equals one typical bottle of beer (12 oz), one-half glass of wine (5 oz), or one shot of hard liquor (1 oz). General instructions Drink enough fluids to keep your urine pale yellow. Take over-the-counter and prescription medicines only as told by your health care provider. Keep all follow-up visits as told by your health care provider. This is important. Contact a health care provider if you have: A fever. Vomiting or diarrhea that does not go away. Get help right away if you: Have pain in your chest, upper arms, jaw, or neck. Become weak or dizzy. Feel faint. Have palpitations that do not go away. Summary In sinus tachycardia, the heart beats more than 100 times a minute. Sinus tachycardia may be harmless, or it may be a sign of a serious condition. Treatment for this condition depends on the cause or the underlying condition. Get help right away if you have pain in your chest, upper arms, jaw, or neck. This information is not intended to replace advice given to you by your health care provider. Make sure you discuss any questions you have with your health care provider. Document Revised: 09/13/2020 Document Reviewed: 09/13/2020 Elsevier Patient Education  2023 Elsevier Inc.  

## 2022-01-22 NOTE — Progress Notes (Signed)
Established Patient Office Visit  Subjective   Patient ID: Amy Jackson, female    DOB: January 05, 1986  Age: 36 y.o. MRN: [id number]  Chief Complaint  Patient presents with   Obesity    HPI Amy Jackson is here for follow up of obesity. She reports doing well on saxenda since her last visit. She has had some mild nasuea that has been intermittent. She has been on the 3 mg dosage for the last 6 weeks now. She has been doing the elliptical daily for 30-45 mins. She has been eating a healthier diet and eating smaller portions. She is now down 12 lbs. She does note that she has not lost any weight in about 2 weeks.    She also reports a history of tachycardia. She has been monitoring her heart rate and reports that her resting heart rate ranges from 90-110 and rises to around 120 with walking. She denies chest pain, shortness of breath, edema, dizziness, or palpitations.   ROS As per HPI.     Objective:     BP 113/71   Pulse 100   Temp 98.7 F (37.1 C) (Temporal)   Ht 5' 5"  (1.651 m)   Wt 177 lb (80.3 kg)   SpO2 98%   BMI 29.45 kg/m    Wt Readings from Last 3 Encounters:  01/22/22 177 lb (80.3 kg)  11/14/21 189 lb (85.7 kg)  10/24/20 180 lb 8 oz (81.9 kg)    Physical Exam Vitals and nursing note reviewed.  Constitutional:      General: She is not in acute distress.    Appearance: She is not ill-appearing, toxic-appearing or diaphoretic.  Cardiovascular:     Rate and Rhythm: Regular rhythm. Tachycardia present. No extrasystoles are present.    Heart sounds: Normal heart sounds. No murmur heard.    No friction rub. No gallop. No S3 or S4 sounds.  Abdominal:     General: Bowel sounds are normal. There is no distension.     Palpations: Abdomen is soft.     Tenderness: There is no abdominal tenderness. There is no guarding or rebound.  Musculoskeletal:     Right lower leg: No edema.     Left lower leg: No edema.  Skin:    General: Skin is warm and dry.  Neurological:      General: No focal deficit present.     Mental Status: She is alert and oriented to person, place, and time.  Psychiatric:        Mood and Affect: Mood normal.        Behavior: Behavior normal.      No results found for any visits on 01/22/22.  Last CBC Lab Results  Component Value Date   WBC 8.2 11/14/2021   HGB 13.8 11/14/2021   HCT 40.6 11/14/2021   MCV 84 11/14/2021   MCH 28.4 11/14/2021   RDW 12.1 11/14/2021   PLT 255 49/67/5916   Last metabolic panel Lab Results  Component Value Date   GLUCOSE 96 11/14/2021   NA 138 11/14/2021   K 4.5 11/14/2021   CL 98 11/14/2021   CO2 24 11/14/2021   BUN 10 11/14/2021   CREATININE 0.58 11/14/2021   EGFR 120 11/14/2021   CALCIUM 9.3 11/14/2021   PROT 7.1 11/14/2021   ALBUMIN 4.3 11/14/2021   LABGLOB 2.8 11/14/2021   AGRATIO 1.5 11/14/2021   BILITOT 0.5 11/14/2021   ALKPHOS 125 (H) 11/14/2021   AST 14 11/14/2021   ALT 18  11/14/2021   ANIONGAP 7 04/27/2015   Last thyroid functions Lab Results  Component Value Date   TSH 1.970 11/14/2021   T4TOTAL 7.4 11/14/2021      The ASCVD Risk score (Arnett DK, et al., 2019) failed to calculate for the following reasons:   The 2019 ASCVD risk score is only valid for ages 39 to 26    Assessment & Plan:   Elina was seen today for obesity.  Diagnoses and all orders for this visit:  Overweight Down 12 lbs. Continue saxenda, diet, and exercise. BMI is now 29. Discussed can switch to wegovy if weight loss plateaus.   Tachycardia Regular. Asymptomatic. Recently has normal labs. Can try low dose of lopressor. She will closely monitor her BP at home.  -     metoprolol tartrate (LOPRESSOR) 25 MG tablet; Take 1/2 tablet (12.5 mg total) by mouth daily.  Return in about 6 weeks (around 03/05/2022) for medication follow up.   The patient indicates understanding of these issues and agrees with the plan.  Gwenlyn Perking, FNP

## 2022-02-11 ENCOUNTER — Other Ambulatory Visit (HOSPITAL_COMMUNITY): Payer: Self-pay

## 2022-02-12 ENCOUNTER — Other Ambulatory Visit: Payer: Self-pay | Admitting: Family Medicine

## 2022-02-12 DIAGNOSIS — E559 Vitamin D deficiency, unspecified: Secondary | ICD-10-CM

## 2022-03-05 ENCOUNTER — Encounter: Payer: Self-pay | Admitting: Family Medicine

## 2022-03-05 ENCOUNTER — Other Ambulatory Visit (HOSPITAL_COMMUNITY): Payer: Self-pay

## 2022-03-05 ENCOUNTER — Ambulatory Visit (INDEPENDENT_AMBULATORY_CARE_PROVIDER_SITE_OTHER): Payer: No Typology Code available for payment source | Admitting: Family Medicine

## 2022-03-05 VITALS — BP 109/74 | HR 88 | Temp 97.5°F | Ht 65.0 in | Wt 169.5 lb

## 2022-03-05 DIAGNOSIS — E559 Vitamin D deficiency, unspecified: Secondary | ICD-10-CM

## 2022-03-05 DIAGNOSIS — R000 Tachycardia, unspecified: Secondary | ICD-10-CM | POA: Diagnosis not present

## 2022-03-05 DIAGNOSIS — E663 Overweight: Secondary | ICD-10-CM

## 2022-03-05 DIAGNOSIS — E669 Obesity, unspecified: Secondary | ICD-10-CM

## 2022-03-05 MED ORDER — INSULIN PEN NEEDLE 32G X 6 MM MISC
1.0000 | Freq: Every day | 2 refills | Status: DC
  Filled 2022-03-05: qty 100, 90d supply, fill #0

## 2022-03-05 MED ORDER — SAXENDA 18 MG/3ML ~~LOC~~ SOPN
3.0000 mg | PEN_INJECTOR | Freq: Every day | SUBCUTANEOUS | 3 refills | Status: DC
  Filled 2022-03-05: qty 12, 24d supply, fill #0
  Filled 2022-03-07: qty 15, 30d supply, fill #0
  Filled 2022-04-15: qty 15, 30d supply, fill #1

## 2022-03-05 NOTE — Patient Instructions (Signed)
Try metoprolol twice a day. If this is more helpful, let me know and I can send in the extended release for this.   If weight loss plateaus, we can switch to [facility] 1.7 mg weekly.

## 2022-03-05 NOTE — Progress Notes (Signed)
Established Patient Office Visit  Subjective   Patient ID: Amy Jackson, female    DOB: 1985-05-31  Age: 36 y.o. MRN: [id number]  Chief Complaint  Patient presents with   Obesity    HPI Amy Jackson is here for a weight loss follow up today. She has been on Saxenda for about 3 months now. Her last follow up was 6 weeks ago. Since then, she has lost 8 lbs. She continues to eat a healthy diet and is walking or using her elliptical daily. She denies side effects.   She reports that her heart rate is under or right at 100 with metoprolol. She denies side effects. She has been monitoring her BP at home and reports that readings have been stable.   She would like to have her vitamin D recheck. She takes a   Past Medical History:  Diagnosis Date   Complication of anesthesia    spinal anes- unsuccessful for 1st CS   Depression    Family history of adverse reaction to anesthesia    mother - difficult intubation   Kidney infection 04/2015      ROS As per HPI.    Objective:     BP 109/74   Pulse 88   Temp (!) 97.5 F (36.4 C) (Temporal)   Ht 5' 5"  (1.651 m)   Wt 169 lb 8 oz (76.9 kg)   SpO2 100%   BMI 28.21 kg/m  Wt Readings from Last 3 Encounters:  03/05/22 169 lb 8 oz (76.9 kg)  01/22/22 177 lb (80.3 kg)  11/14/21 189 lb (85.7 kg)      Physical Exam Vitals and nursing note reviewed.  Constitutional:      General: She is not in acute distress.    Appearance: She is not ill-appearing, toxic-appearing or diaphoretic.  Cardiovascular:     Rate and Rhythm: Normal rate and regular rhythm. No extrasystoles are present.    Heart sounds: Normal heart sounds. No murmur heard.    No friction rub. No gallop. No S3 or S4 sounds.  Abdominal:     General: Bowel sounds are normal. There is no distension.     Palpations: Abdomen is soft.     Tenderness: There is no abdominal tenderness. There is no guarding or rebound.  Musculoskeletal:     Right lower leg: No edema.     Left  lower leg: No edema.  Skin:    General: Skin is warm and dry.  Neurological:     General: No focal deficit present.     Mental Status: She is alert and oriented to person, place, and time.  Psychiatric:        Mood and Affect: Mood normal.        Behavior: Behavior normal.      No results found for any visits on 03/05/22.  Last CBC Lab Results  Component Value Date   WBC 8.2 11/14/2021   HGB 13.8 11/14/2021   HCT 40.6 11/14/2021   MCV 84 11/14/2021   MCH 28.4 11/14/2021   RDW 12.1 11/14/2021   PLT 255 16/24/4695   Last metabolic panel Lab Results  Component Value Date   GLUCOSE 96 11/14/2021   NA 138 11/14/2021   K 4.5 11/14/2021   CL 98 11/14/2021   CO2 24 11/14/2021   BUN 10 11/14/2021   CREATININE 0.58 11/14/2021   EGFR 120 11/14/2021   CALCIUM 9.3 11/14/2021   PROT 7.1 11/14/2021   ALBUMIN 4.3 11/14/2021  LABGLOB 2.8 11/14/2021   AGRATIO 1.5 11/14/2021   BILITOT 0.5 11/14/2021   ALKPHOS 125 (H) 11/14/2021   AST 14 11/14/2021   ALT 18 11/14/2021   ANIONGAP 7 04/27/2015   Last lipids Lab Results  Component Value Date   CHOL 162 11/14/2021   HDL 57 11/14/2021   LDLCALC 83 11/14/2021   TRIG 123 11/14/2021   CHOLHDL 2.8 11/14/2021   Last hemoglobin A1c Lab Results  Component Value Date   HGBA1C 4.9 11/14/2021   Last thyroid functions Lab Results  Component Value Date   TSH 1.970 11/14/2021   T4TOTAL 7.4 11/14/2021      The ASCVD Risk score (Arnett DK, et al., 2019) failed to calculate for the following reasons:   The 2019 ASCVD risk score is only valid for ages 52 to 81    Assessment & Plan:   Amy Jackson was seen today for obesity.  Diagnoses and all orders for this visit:  Overweight BMI now 28. Down about 20 lbs since starting saxenda. Continue saxenda, diet, and exercise. Discussed can transition to [facility] if weight loss plateaus. Will recheck renal function today.  -     Liraglutide -Weight Management (SAXENDA) 18 MG/3ML SOPN; Inject 3  mg into the skin daily. -     Insulin Pen Needle 32G X 6 MM MISC; Use daily as directed  Tachycardia Improved with metoprolol. Discussed she can try this BID. If heart rate is better controlled with BId dosing, will switch to extended release.   Vitamin D insufficiency Will recheck today.  -     VITAMIN D 25 Hydroxy (Vit-D Deficiency, Fractures)   Return in about 8 weeks (around 04/30/2022) for weight loss follow up.   The patient indicates understanding of these issues and agrees with the plan.  Gwenlyn Perking, FNP

## 2022-03-06 ENCOUNTER — Other Ambulatory Visit (HOSPITAL_COMMUNITY): Payer: Self-pay

## 2022-03-06 LAB — BMP8+EGFR
BUN/Creatinine Ratio: 14 (ref 9–23)
BUN: 8 mg/dL (ref 6–20)
CO2: 22 mmol/L (ref 20–29)
Calcium: 9.5 mg/dL (ref 8.7–10.2)
Chloride: 102 mmol/L (ref 96–106)
Creatinine, Ser: 0.59 mg/dL (ref 0.57–1.00)
Glucose: 84 mg/dL (ref 70–99)
Potassium: 4 mmol/L (ref 3.5–5.2)
Sodium: 140 mmol/L (ref 134–144)
eGFR: 120 mL/min/{1.73_m2} (ref >59)

## 2022-03-06 LAB — VITAMIN D 25 HYDROXY (VIT D DEFICIENCY, FRACTURES): Vit D, 25-Hydroxy: 44.2 ng/mL (ref 30.0–100.0)

## 2022-03-07 ENCOUNTER — Other Ambulatory Visit (HOSPITAL_COMMUNITY): Payer: Self-pay

## 2022-03-12 ENCOUNTER — Other Ambulatory Visit (HOSPITAL_COMMUNITY): Payer: Self-pay

## 2022-03-14 ENCOUNTER — Telehealth: Payer: Self-pay | Admitting: *Deleted

## 2022-03-14 NOTE — Telephone Encounter (Signed)
Approved  03/14/2022 - 03/14/2023  Faxed approval letter to pharmacy

## 2022-03-14 NOTE — Telephone Encounter (Signed)
PA for Saxenda-In Process  Key: [id number]  PA#   [id number]   [employer] is reviewing your PA request. You may close this dialog, return to your dashboard, and perform other tasks.  To check for an update later, open this request again from your dashboard. If [employer] has not replied within 24 hours for urgent requests or within 48 hours for standard requests, please contact [employer] at [phone].

## 2022-03-25 ENCOUNTER — Other Ambulatory Visit (HOSPITAL_COMMUNITY): Payer: Self-pay

## 2022-04-15 ENCOUNTER — Other Ambulatory Visit (HOSPITAL_COMMUNITY): Payer: Self-pay

## 2022-04-22 ENCOUNTER — Encounter: Payer: Self-pay | Admitting: Family Medicine

## 2022-04-23 ENCOUNTER — Other Ambulatory Visit: Payer: Self-pay | Admitting: Family Medicine

## 2022-04-23 ENCOUNTER — Other Ambulatory Visit (HOSPITAL_COMMUNITY): Payer: Self-pay

## 2022-04-23 DIAGNOSIS — R000 Tachycardia, unspecified: Secondary | ICD-10-CM

## 2022-04-23 MED ORDER — METOPROLOL SUCCINATE ER 50 MG PO TB24
50.0000 mg | ORAL_TABLET | Freq: Every day | ORAL | 3 refills | Status: DC
  Filled 2022-04-23: qty 90, 90d supply, fill #0

## 2022-04-30 ENCOUNTER — Other Ambulatory Visit (INDEPENDENT_AMBULATORY_CARE_PROVIDER_SITE_OTHER): Payer: No Typology Code available for payment source

## 2022-04-30 ENCOUNTER — Ambulatory Visit (INDEPENDENT_AMBULATORY_CARE_PROVIDER_SITE_OTHER): Payer: No Typology Code available for payment source | Admitting: Family Medicine

## 2022-04-30 ENCOUNTER — Encounter: Payer: Self-pay | Admitting: Family Medicine

## 2022-04-30 ENCOUNTER — Other Ambulatory Visit (HOSPITAL_COMMUNITY): Payer: Self-pay

## 2022-04-30 VITALS — BP 121/79 | HR 113 | Temp 99.9°F | Ht 65.0 in | Wt 170.0 lb

## 2022-04-30 DIAGNOSIS — E663 Overweight: Secondary | ICD-10-CM

## 2022-04-30 DIAGNOSIS — R000 Tachycardia, unspecified: Secondary | ICD-10-CM

## 2022-04-30 DIAGNOSIS — F419 Anxiety disorder, unspecified: Secondary | ICD-10-CM | POA: Diagnosis not present

## 2022-04-30 MED ORDER — SEMAGLUTIDE-WEIGHT MANAGEMENT 2.4 MG/0.75ML ~~LOC~~ SOAJ: 2.4000 mg | SUBCUTANEOUS | 6 refills | Status: DC

## 2022-04-30 MED ORDER — SEMAGLUTIDE-WEIGHT MANAGEMENT 1.7 MG/0.75ML ~~LOC~~ SOAJ: 1.7000 mg | SUBCUTANEOUS | 0 refills | Status: DC

## 2022-04-30 MED ORDER — SEMAGLUTIDE-WEIGHT MANAGEMENT 1 MG/0.5ML ~~LOC~~ SOAJ: 1.0000 mg | SUBCUTANEOUS | 0 refills | Status: DC

## 2022-04-30 MED ORDER — ESCITALOPRAM OXALATE 5 MG PO TABS
5.0000 mg | ORAL_TABLET | Freq: Every day | ORAL | 1 refills | Status: DC
  Filled 2022-05-05: qty 90, 90d supply, fill #0
  Filled 2022-07-22: qty 90, 90d supply, fill #1

## 2022-04-30 NOTE — Progress Notes (Signed)
Established Patient Office Visit  Subjective   Patient ID: Amy Jackson, female    DOB: [birth year]  Age: 36 y.o. MRN: [id number]  Chief Complaint  Patient presents with   Obesity   Tachycardia    HPI Tachycardia She reports that her HR has always been elevated for as long as she can remember. Her heart rate is 90 at rest at the lowest. Even with metoprolol, it has been running around 100. She often is 110s-120s at rest. Denies symptoms other than sometimes feeling her heart race. She has an appt with cardiology is on 05/26/21.  2. Weight loss She feels like saxenda is no longer curbing her appetite. She has been maintaining a healthy diet for the most part. This has been harder for her with the holidays. She walks or uses her elliptical daily. She is interested in switching to wegovy.   3. Anxiety She feels like she is up and down with anxiety and depression symptoms. She feels irritable and easily overwhelmed at times. She was on prozac at one time but stopped this because of side effects. She did feel like it helped manage her symptoms and would like to try another medication.      04/30/2022   10:29 AM 03/05/2022   10:38 AM 01/22/2022   11:16 AM  Depression screen PHQ 2/9  Decreased Interest 0 0 0  Down, Depressed, Hopeless 0 0 0  PHQ - 2 Score 0 0 0  Altered sleeping 0 0 0  Tired, decreased energy 0 0 0  Change in appetite 0 0 0  Feeling bad or failure about yourself  0 0 0  Trouble concentrating 0 0 0  Moving slowly or fidgety/restless 0 0 0  Suicidal thoughts 0 0 0  PHQ-9 Score 0 0 0  Difficult doing work/chores Not difficult at all Not difficult at all Not difficult at all      03/05/2022   10:38 AM 01/22/2022   11:17 AM 11/14/2021    9:22 AM 10/24/2020   10:34 AM  GAD 7 : Generalized Anxiety Score  Nervous, Anxious, on Edge 0 0 0 0  Control/stop worrying 0 0 0 0  Worry too much - different things 0 0 0 0  Trouble relaxing 0 0 0 0  Restless 0 0 0 0  Easily  annoyed or irritable 0 0 0 0  Afraid - awful might happen 0 0 0 0  Total GAD 7 Score 0 0 0 0  Anxiety Difficulty Not difficult at all Not difficult at all Not difficult at all Not difficult at all     Past Medical History:  Diagnosis Date   Complication of anesthesia    spinal anes- unsuccessful for 1st CS   Depression    Family history of adverse reaction to anesthesia    mother - difficult intubation   Kidney infection 04/2015      ROS As per HPI.   Objective:     BP 121/79   Pulse (!) 113   Temp 99.9 F (37.7 C) (Temporal)   Ht _0  (1.651 m)   Wt 170 lb (77.1 kg)   SpO2 100%   BMI 28.29 kg/m  Wt Readings from Last 3 Encounters:  04/30/22 170 lb (77.1 kg)  03/05/22 169 lb 8 oz (76.9 kg)  01/22/22 177 lb (80.3 kg)      Physical Exam Vitals and nursing note reviewed.  Constitutional:      General: She is not in  acute distress.    Appearance: She is not ill-appearing, toxic-appearing or diaphoretic.  Neck:     Vascular: No carotid bruit.  Cardiovascular:     Rate and Rhythm: Regular rhythm. Tachycardia present.     Heart sounds: Normal heart sounds. No murmur heard. Pulmonary:     Effort: Pulmonary effort is normal. No respiratory distress.     Breath sounds: Normal breath sounds. No wheezing, rhonchi or rales.  Musculoskeletal:     Cervical back: No rigidity.     Right lower leg: No edema.     Left lower leg: No edema.  Skin:    General: Skin is warm and dry.  Neurological:     General: No focal deficit present.     Mental Status: She is alert and oriented to person, place, and time.  Psychiatric:        Mood and Affect: Mood normal.        Behavior: Behavior normal.        Thought Content: Thought content normal.        Judgment: Judgment normal.      No results found for any visits on 04/30/22.  Last CBC Lab Results  Component Value Date   WBC 8.2 11/14/2021   HGB 13.8 11/14/2021   HCT 40.6 11/14/2021   MCV 84 11/14/2021   MCH 28.4  11/14/2021   RDW 12.1 11/14/2021   PLT 255 41/32/4401   Last metabolic panel Lab Results  Component Value Date   GLUCOSE 84 03/05/2022   NA 140 03/05/2022   K 4.0 03/05/2022   CL 102 03/05/2022   CO2 22 03/05/2022   BUN 8 03/05/2022   CREATININE 0.59 03/05/2022   EGFR 120 03/05/2022   CALCIUM 9.5 03/05/2022   PROT 7.1 11/14/2021   ALBUMIN 4.3 11/14/2021   LABGLOB 2.8 11/14/2021   AGRATIO 1.5 11/14/2021   BILITOT 0.5 11/14/2021   ALKPHOS 125 (H) 11/14/2021   AST 14 11/14/2021   ALT 18 11/14/2021   ANIONGAP 7 04/27/2015   Last thyroid functions Lab Results  Component Value Date   TSH 1.970 11/14/2021   T4TOTAL 7.4 11/14/2021      The ASCVD Risk score (Arnett DK, et al., 2019) failed to calculate for the following reasons:   The 2019 ASCVD risk score is only valid for ages 86 to 38    Assessment & Plan:   Amy Jackson was seen today for obesity and tachycardia.  Diagnoses and all orders for this visit:  Tachycardia Chronic. Some improvement with metoprolol, but HR remains around 100. Denies other symptoms. Has had normal lab work up. Zio placed today, she will hold metoprolol while on the zio. Has cardiology appt next month.  -     LONG TERM MONITOR (3-14 DAYS); Future  Overweight BMI 28. Weight remains stable, but no additional weight loss with saxenda. Will switch to [facility] as below. Continue diet and exercise.  -     Semaglutide-Weight Management 1 MG/0.5ML SOAJ; Inject 1 mg into the skin once a week for 28 days. -     Semaglutide-Weight Management 1.7 MG/0.75ML SOAJ; Inject 1.7 mg into the skin once a week for 28 days. -     Semaglutide-Weight Management 2.4 MG/0.75ML SOAJ; Inject 2.4 mg into the skin once a week for 28 days.  Anxiety Start lexapro as below.  -     escitalopram (LEXAPRO) 5 MG tablet; Take 1 tablet (5 mg total) by mouth daily.  Return in about 8 weeks (  around 06/25/2022) for follow up.   The patient indicates understanding of these issues and  agrees with the plan.  Amy Perking, FNP

## 2022-05-01 ENCOUNTER — Other Ambulatory Visit (HOSPITAL_COMMUNITY): Payer: Self-pay

## 2022-05-01 MED ORDER — SEMAGLUTIDE-WEIGHT MANAGEMENT 1.7 MG/0.75ML ~~LOC~~ SOAJ
1.7000 mg | SUBCUTANEOUS | 0 refills | Status: DC
  Filled 2022-05-01 – 2022-05-29 (×2): qty 3, 28d supply, fill #0

## 2022-05-05 ENCOUNTER — Telehealth: Payer: Self-pay

## 2022-05-05 ENCOUNTER — Other Ambulatory Visit (HOSPITAL_COMMUNITY): Payer: Self-pay

## 2022-05-05 NOTE — Telephone Encounter (Signed)
Pharmacy Patient Advocate Encounter   Received notification from [facility] that prior authorization for Wegovy 1.7mg /0.33ml is required/requested.  Per Test Claim: Prior Berkley Harvey is needed for the [facility]   PA submitted on 05/05/22 to (ins) [employer] via CoverMyMeds  Key [id number]  Status is pending

## 2022-05-07 NOTE — Telephone Encounter (Signed)
Received a fax regarding Prior Authorization from [employer] for [facility] 1.7mg /0.36ml pen.   Authorization has been DENIED because for weight loss or weight management, guidelines require you meet one of the following: a) Body mass index (BMI) of 30 kg/m(2) or greater. b) BMI of 27 kg/m(2) or greater and at least one weight-related comorbidity, such as high  blood pressure, type 2 diabetes mellitus (a chronic condition in which your body is resistant to insulin, a hormone that regulates your blood sugar), or high cholesterol.   Phone# [phone]

## 2022-05-14 ENCOUNTER — Encounter: Payer: Self-pay | Admitting: Family Medicine

## 2022-05-21 ENCOUNTER — Other Ambulatory Visit (HOSPITAL_COMMUNITY): Payer: Self-pay

## 2022-05-21 ENCOUNTER — Encounter: Payer: Self-pay | Admitting: Family Medicine

## 2022-05-22 ENCOUNTER — Other Ambulatory Visit (HOSPITAL_COMMUNITY): Payer: Self-pay

## 2022-05-22 NOTE — Telephone Encounter (Signed)
Routed to pharmacist to assist with appeal. A signed letter of consent from the patient must be submitted with the appeal. The letter must be on an office letterhead. Please advise

## 2022-05-23 NOTE — Progress Notes (Unsigned)
Cardiology Office Note   Date:  05/26/2022   ID:  NAWAL BURLING, DOB [birth year], MRN [id number]  PCP:  Gabriel Earing, FNP  Cardiologist:   None Referring:  Gabriel Earing, FNP  Chief Complaint  Patient presents with   Palpitations      History of Present Illness: Amy Jackson is a 37 y.o. female who is referred by Gabriel Earing, FNP for evaluation of tachycardia.  She wore a monitor with sinus rhythm with  occasional sinus tachycardia.  I did review this for this visit.  This was ordered by her primary physician.  There were occasional isolated ventricular ectopic beats 1.6% of the time.  There were rare couplets.  The patient reports that she said her heart is beating fast and waking her up at night probably for the last month.  She has had higher resting heart rate.  That she has noticed for some time.  She does have stress taking care of of her father who has Alzheimer's.  She works in the hospital.  She has 2 children one of them is 13 1 is 7.  She says that she feels palpitations at night.  She does get occasional skipping during the day as well.  She was put on metoprolol but it was short acting once a day.  This was changed to Toprol-XL.  She started taking this at night.  However she still gets the symptoms.  Is not any presyncope or syncope.  She has occasional chest discomfort but nothing reproducible.  She might be a little short of breath climbing stairs occasionally but she can get on her elliptical without shortness of breath, chest pressure, neck or arm discomfort.  She has no PND or orthopnea.  She does drink caffeine.  She had blood work last year to include normal thyroid electrolytes and CBC.  She had an unofficial echocardiogram she reported that suggested some diastolic dysfunction but there is no report of this.   Past Medical History:  Diagnosis Date   Complication of anesthesia    spinal anes- unsuccessful for 1st CS   Depression    Family history  of adverse reaction to anesthesia    mother - difficult intubation   Kidney infection 04/2015    Past Surgical History:  Procedure Laterality Date   CESAREAN SECTION     x1   CESAREAN SECTION N/A 02/16/2015   Procedure: REPEAT CESAREAN SECTION;  Surgeon: Waynard Reeds, MD;  Location: [facility];  Service: Obstetrics;  Laterality: N/A;   DILATION AND CURETTAGE OF UTERUS     MOUTH SURGERY       Current Outpatient Medications  Medication Sig Dispense Refill   escitalopram (LEXAPRO) 5 MG tablet Take 1 tablet (5 mg total) by mouth daily. 90 tablet 1   Insulin Pen Needle 32G X 6 MM MISC Use daily as directed 90 each 2   levonorgestrel-ethinyl estradiol (NORDETTE) 0.15-30 MG-MCG tablet Take 1 tablet by mouth daily. Reported on 05/10/2015-Gets From GYN     metoprolol succinate (TOPROL-XL) 50 MG 24 hr tablet Take 1 tablet (50 mg total) by mouth daily. Take with or immediately following a meal. 90 tablet 3   [START ON 06/27/2022] Semaglutide-Weight Management 1 MG/0.5ML SOAJ Inject 1 mg into the skin once a week for 28 days. 2 mL 0   [START ON 07/26/2022] Semaglutide-Weight Management 1.7 MG/0.75ML SOAJ Inject 1.7 mg into the skin once a week for 28 days. 3 mL 0   [  START ON 08/24/2022] Semaglutide-Weight Management 2.4 MG/0.75ML SOAJ Inject 2.4 mg into the skin once a week for 28 days. 3 mL 6   Vitamin D, Ergocalciferol, (DRISDOL) 1.25 MG (50000 UNIT) CAPS capsule Take 1 capsule (50,000 Units total) by mouth every 7 (seven) days. 8 capsule 0   No current facility-administered medications for this visit.    Allergies:   Dilaudid [hydromorphone hcl]    Social History:  The patient  reports that she has never smoked. She has never used smokeless tobacco. She reports that she does not drink alcohol and does not use drugs.   Family History:  The patient's family history includes Cancer in her maternal grandmother; Diabetes in her maternal grandmother and mother; Heart disease in her paternal grandmother;  Hyperlipidemia in her father; Hypertension in her father and mother.    ROS:  Please see the history of present illness.   Otherwise, review of systems are positive for none.   All other systems are reviewed and negative.    PHYSICAL EXAM: VS:  BP (!) 140/78   Pulse 88   Ht 5\' 5"  (1.651 m)   Wt 164 lb 12.8 oz (74.8 kg)   SpO2 98%   BMI 27.42 kg/m  , BMI Body mass index is 27.42 kg/m. GENERAL:  Well appearing HEENT:  Pupils equal round and reactive, fundi not visualized, oral mucosa unremarkable NECK:  No jugular venous distention, waveform within normal limits, carotid upstroke brisk and symmetric, no bruits, no thyromegaly LYMPHATICS:  No cervical, inguinal adenopathy LUNGS:  Clear to auscultation bilaterally BACK:  No CVA tenderness CHEST:  Unremarkable HEART:  PMI not displaced or sustained,S1 and S2 within normal limits, no S3, no S4, no clicks, no rubs, no murmurs ABD:  Flat, positive bowel sounds normal in frequency in pitch, no bruits, no rebound, no guarding, no midline pulsatile mass, no hepatomegaly, no splenomegaly EXT:  2 plus pulses throughout, no edema, no cyanosis no clubbing SKIN:  No rashes no nodules NEURO:  Cranial nerves II through XII grossly intact, motor grossly intact throughout PSYCH:  Cognitively intact, oriented to person place and time    EKG:  EKG  ordered today. The ekg ordered today demonstrates sinus rhythm, rate 97, axis within normal limits, intervals within normal limits, no acute ST-T wave changes.   Recent Labs: 11/14/2021: ALT 18; Hemoglobin 13.8; Platelets 255; TSH 1.970 03/05/2022: BUN 8; Creatinine, Ser 0.59; Potassium 4.0; Sodium 140    Lipid Panel    Component Value Date/Time   CHOL 162 11/14/2021 1001   TRIG 123 11/14/2021 1001   HDL 57 11/14/2021 1001   CHOLHDL 2.8 11/14/2021 1001   LDLCALC 83 11/14/2021 1001      Wt Readings from Last 3 Encounters:  05/26/22 164 lb 12.8 oz (74.8 kg)  04/30/22 170 lb (77.1 kg)  03/05/22  169 lb 8 oz (76.9 kg)      Other studies Reviewed: Additional studies/ records that were reviewed today include: Labs, monitor as reported above. Review of the above records demonstrates:  Please see elsewhere in the note.     ASSESSMENT AND PLAN:  Tachycardia: The patient does have some resting heart rates that are in the 90s but there is a slight drop during sleep.  She had ventricular ectopy as described but no sustained arrhythmias.  She had normal electrolytes, thyroid and CBC.  I am going to start with an echocardiogram.  I am going to increase her metoprolol to 50 mg twice daily.  This  will be XL.  If this does not work a probably switch to a calcium channel blocker.  I have also instructed her on improvement she could expect with exercise.  Hypertension: She will keep a blood pressure diary on the increased dose of beta-blockers.   Current medicines are reviewed at length with the patient today.  The patient does not have concerns regarding medicines.  The following changes have been made:  no change  Labs/ tests ordered today include: None  Orders Placed This Encounter  Procedures   EKG 12-Lead   ECHOCARDIOGRAM COMPLETE     Disposition:   FU with me in six weeks.     Signed, Minus Breeding, MD  05/26/2022 9:42 AM    [place]

## 2022-05-26 ENCOUNTER — Encounter: Payer: Self-pay | Admitting: Cardiology

## 2022-05-26 ENCOUNTER — Ambulatory Visit: Payer: 59 | Attending: Cardiology | Admitting: Cardiology

## 2022-05-26 ENCOUNTER — Other Ambulatory Visit (HOSPITAL_COMMUNITY): Payer: Self-pay

## 2022-05-26 VITALS — BP 140/78 | HR 88 | Ht 65.0 in | Wt 164.8 lb

## 2022-05-26 DIAGNOSIS — R000 Tachycardia, unspecified: Secondary | ICD-10-CM

## 2022-05-26 DIAGNOSIS — I479 Paroxysmal tachycardia, unspecified: Secondary | ICD-10-CM | POA: Diagnosis not present

## 2022-05-26 DIAGNOSIS — R002 Palpitations: Secondary | ICD-10-CM | POA: Diagnosis not present

## 2022-05-26 MED ORDER — METOPROLOL SUCCINATE ER 50 MG PO TB24
50.0000 mg | ORAL_TABLET | Freq: Two times a day (BID) | ORAL | 3 refills | Status: DC
  Filled 2022-05-26: qty 180, 90d supply, fill #0
  Filled 2022-07-22 – 2022-09-11 (×3): qty 180, 90d supply, fill #1

## 2022-05-26 NOTE — Patient Instructions (Signed)
  Testing/Procedures:  Your physician has requested that you have an echocardiogram. Echocardiography is a painless test that uses sound waves to create images of your heart. It provides your doctor with information about the size and shape of your heart and how well your heart's chambers and valves are working. This procedure takes approximately one hour. There are no restrictions for this procedure. Please do NOT wear cologne, perfume, aftershave, or lotions (deodorant is allowed). Please arrive 15 minutes prior to your appointment time. [place], you and your health needs are our priority.  As part of our continuing mission to provide you with exceptional heart care, we have created designated Provider Care Teams.  These Care Teams include your primary Cardiologist (physician) and Advanced Practice Providers (APPs -  Physician Assistants and Nurse Practitioners) who all work together to provide you with the care you need, when you need it.  We recommend signing up for the patient portal called "MyChart".  Sign up information is provided on this After Visit Summary.  MyChart is used to connect with patients for Virtual Visits (Telemedicine).  Patients are able to view lab/test results, encounter notes, upcoming appointments, etc.  Non-urgent messages can be sent to your provider as well.   To learn more about what you can do with MyChart, go to [email or web address].    Your next appointment:   6 week(s)  The format for your next appointment:   In Person  Provider:   Minus Breeding MD

## 2022-05-29 ENCOUNTER — Other Ambulatory Visit: Payer: Self-pay

## 2022-05-29 ENCOUNTER — Other Ambulatory Visit (HOSPITAL_COMMUNITY): Payer: Self-pay

## 2022-05-31 ENCOUNTER — Other Ambulatory Visit (HOSPITAL_COMMUNITY): Payer: Self-pay

## 2022-06-03 ENCOUNTER — Other Ambulatory Visit: Payer: Self-pay

## 2022-06-03 NOTE — Telephone Encounter (Signed)
Patient Advocate Encounter  The appeal for Amy Jackson has been approved with [employer].    Effective for a maximum of 12 fills from 05/29/22 to 05/29/23.  Approval letter attached to chart.

## 2022-06-05 ENCOUNTER — Other Ambulatory Visit (HOSPITAL_COMMUNITY): Payer: Self-pay

## 2022-06-06 ENCOUNTER — Other Ambulatory Visit (HOSPITAL_COMMUNITY): Payer: Self-pay

## 2022-06-06 ENCOUNTER — Ambulatory Visit (HOSPITAL_COMMUNITY): Payer: 59 | Attending: Cardiovascular Disease

## 2022-06-06 DIAGNOSIS — I479 Paroxysmal tachycardia, unspecified: Secondary | ICD-10-CM | POA: Insufficient documentation

## 2022-06-06 DIAGNOSIS — R002 Palpitations: Secondary | ICD-10-CM | POA: Diagnosis not present

## 2022-06-06 LAB — ECHOCARDIOGRAM COMPLETE
Area-P 1/2: 5.62 cm2
S' Lateral: 3 cm

## 2022-06-25 ENCOUNTER — Ambulatory Visit: Payer: 59 | Admitting: Family Medicine

## 2022-06-30 ENCOUNTER — Other Ambulatory Visit (HOSPITAL_COMMUNITY): Payer: Self-pay

## 2022-06-30 ENCOUNTER — Telehealth: Payer: Self-pay | Admitting: Family Medicine

## 2022-06-30 DIAGNOSIS — E663 Overweight: Secondary | ICD-10-CM

## 2022-07-01 NOTE — Telephone Encounter (Signed)
Request sent for 1.7 mg, which was sent to [facility], below note along w/ request, this dose was sent to [facility] comment: patient needs rx for 2.62m please

## 2022-07-03 ENCOUNTER — Other Ambulatory Visit: Payer: Self-pay

## 2022-07-03 ENCOUNTER — Other Ambulatory Visit (HOSPITAL_COMMUNITY): Payer: Self-pay

## 2022-07-03 DIAGNOSIS — E663 Overweight: Secondary | ICD-10-CM

## 2022-07-03 MED ORDER — SEMAGLUTIDE-WEIGHT MANAGEMENT 2.4 MG/0.75ML ~~LOC~~ SOAJ
2.4000 mg | SUBCUTANEOUS | 6 refills | Status: DC
  Filled 2022-07-03: qty 3, 28d supply, fill #0
  Filled 2022-07-22 – 2022-07-29 (×2): qty 3, 28d supply, fill #1
  Filled 2022-08-25: qty 3, 28d supply, fill #2
  Filled 2022-09-20 – 2022-10-08 (×3): qty 3, 28d supply, fill #3

## 2022-07-03 NOTE — Telephone Encounter (Signed)
Called and left message that medication has been sent to pharmacy.

## 2022-07-03 NOTE — Telephone Encounter (Signed)
Increase to 2.4 mg weekly. Ok to sent Rx with refills.

## 2022-07-03 NOTE — Telephone Encounter (Signed)
Called and talked with the patient - she states that she is currently taking 1.7 mg and is tolerating well for 1 month patient is not sure if she should stay on this dose or move up to the 2.4. I reviewed RX in chart and they were sent with future fill date so pharmacy needs new RX - please advise on dose

## 2022-07-08 ENCOUNTER — Other Ambulatory Visit (HOSPITAL_COMMUNITY): Payer: Self-pay

## 2022-07-15 NOTE — Progress Notes (Unsigned)
Cardiology Office Note   Date:  07/17/2022   ID:  Amy Jackson, DOB November 17, 1985, MRN [id number]  PCP:  Gwenlyn Perking, FNP  Cardiologist:   Minus Breeding, MD Referring:  Gwenlyn Perking, FNP  Chief Complaint  Patient presents with   Palpitations      History of Present Illness: Amy Jackson is a 37 y.o. female who is referred by Gwenlyn Perking, FNP for evaluation of tachycardia.  She wore a monitor with sinus rhythm with  occasional sinus tachycardia.  This was ordered by her primary physician.  There were occasional isolated ventricular ectopic beats 1.6% of the time.  There were rare couplets.  She had a normal echo when I first saw her.    We switched her metoprolol to twice daily and she has done well.  She says the palpitations at night are much better.  She denies any presyncope or syncope.  She has had no new shortness of breath, PND or orthopnea.   Past Medical History:  Diagnosis Date   Complication of anesthesia    spinal anes- unsuccessful for 1st CS   Depression    Family history of adverse reaction to anesthesia    mother - difficult intubation   Kidney infection 04/2015    Past Surgical History:  Procedure Laterality Date   CESAREAN SECTION     x1   CESAREAN SECTION N/A 02/16/2015   Procedure: REPEAT CESAREAN SECTION;  Surgeon: Vanessa Kick, MD;  Location: [facility];  Service: Obstetrics;  Laterality: N/A;   DILATION AND CURETTAGE OF UTERUS     MOUTH SURGERY       Current Outpatient Medications  Medication Sig Dispense Refill   escitalopram (LEXAPRO) 5 MG tablet Take 1 tablet (5 mg total) by mouth daily. 90 tablet 1   levonorgestrel-ethinyl estradiol (NORDETTE) 0.15-30 MG-MCG tablet Take 1 tablet by mouth daily. Reported on 05/10/2015-Gets From GYN     metoprolol succinate (TOPROL-XL) 50 MG 24 hr tablet Take 1 tablet (50 mg total) by mouth 2 (two) times daily. Take with or immediately following a meal. 180 tablet 3   [START ON 08/24/2022]  Semaglutide-Weight Management 2.4 MG/0.75ML SOAJ Inject 2.4 mg into the skin once a week. 3 mL 6   No current facility-administered medications for this visit.    Allergies:   Dilaudid [hydromorphone hcl]    ROS:  Please see the history of present illness.   Otherwise, review of systems are positive for none.   All other systems are reviewed and negative.    PHYSICAL EXAM: VS:  BP 112/75   Pulse 98   Ht '5\' 5"'$  (1.651 m)   Wt 157 lb (71.2 kg)   SpO2 98%   BMI 26.13 kg/m  , BMI Body mass index is 26.13 kg/m. GENERAL:  Well appearing NECK:  No jugular venous distention, waveform within normal limits, carotid upstroke brisk and symmetric, no bruits, no thyromegaly LUNGS:  Clear to auscultation bilaterally CHEST:  Unremarkable HEART:  PMI not displaced or sustained,S1 and S2 within normal limits, no S3, no S4, no clicks, no rubs, no murmurs ABD:  Flat, positive bowel sounds normal in frequency in pitch, no bruits, no rebound, no guarding, no midline pulsatile mass, no hepatomegaly, no splenomegaly EXT:  2 plus pulses throughout, no edema, no cyanosis no clubbing   EKG:  EKG not ordered today.    Recent Labs: 11/14/2021: ALT 18; Hemoglobin 13.8; Platelets 255; TSH 1.970 07/16/2022: BUN 6; Creatinine, Ser  0.62; Potassium 4.9; Sodium 141    Lipid Panel    Component Value Date/Time   CHOL 162 11/14/2021 1001   TRIG 123 11/14/2021 1001   HDL 57 11/14/2021 1001   CHOLHDL 2.8 11/14/2021 1001   LDLCALC 83 11/14/2021 1001      Wt Readings from Last 3 Encounters:  07/17/22 157 lb (71.2 kg)  07/16/22 157 lb 4 oz (71.3 kg)  05/26/22 164 lb 12.8 oz (74.8 kg)      Other studies Reviewed: Additional studies/ records that were reviewed today include: Labs Review of the above records demonstrates:  Please see elsewhere in the note.     ASSESSMENT AND PLAN:  Tachycardia:   She is doing well on metoprolol XL twice daily, continue this regimen.  We talked about exercise to improve  her vagal tone.  Otherwise no change in therapy.   Hypertension:   Her blood pressure was mildly elevated 1 time she showed me a blood pressure diary is well-controlled.  No change in therapy.   Current medicines are reviewed at length with the patient today.  The patient does not have concerns regarding medicines.  The following changes have been made:  None  Labs/ tests ordered today include: None  No orders of the defined types were placed in this encounter.   Disposition:   FU with me as needed.   Signed, Minus Breeding, MD  07/17/2022 10:26 AM    [place]

## 2022-07-16 ENCOUNTER — Encounter: Payer: Self-pay | Admitting: Family Medicine

## 2022-07-16 ENCOUNTER — Ambulatory Visit (INDEPENDENT_AMBULATORY_CARE_PROVIDER_SITE_OTHER): Payer: 59 | Admitting: Family Medicine

## 2022-07-16 ENCOUNTER — Ambulatory Visit: Payer: Self-pay | Admitting: Cardiology

## 2022-07-16 VITALS — BP 104/76 | HR 86 | Temp 98.1°F | Ht 65.0 in | Wt 157.2 lb

## 2022-07-16 DIAGNOSIS — F339 Major depressive disorder, recurrent, unspecified: Secondary | ICD-10-CM | POA: Diagnosis not present

## 2022-07-16 DIAGNOSIS — R109 Unspecified abdominal pain: Secondary | ICD-10-CM | POA: Diagnosis not present

## 2022-07-16 DIAGNOSIS — M545 Low back pain, unspecified: Secondary | ICD-10-CM | POA: Diagnosis not present

## 2022-07-16 DIAGNOSIS — F419 Anxiety disorder, unspecified: Secondary | ICD-10-CM | POA: Insufficient documentation

## 2022-07-16 DIAGNOSIS — R000 Tachycardia, unspecified: Secondary | ICD-10-CM

## 2022-07-16 DIAGNOSIS — E669 Obesity, unspecified: Secondary | ICD-10-CM | POA: Diagnosis not present

## 2022-07-16 LAB — URINALYSIS, ROUTINE W REFLEX MICROSCOPIC
Bilirubin, UA: NEGATIVE
Glucose, UA: NEGATIVE
Ketones, UA: NEGATIVE
Leukocytes,UA: NEGATIVE
Nitrite, UA: NEGATIVE
Protein,UA: NEGATIVE
RBC, UA: NEGATIVE
Specific Gravity, UA: 1.01 (ref 1.005–1.030)
Urobilinogen, Ur: 0.2 mg/dL (ref 0.2–1.0)
pH, UA: 7 (ref 5.0–7.5)

## 2022-07-16 NOTE — Progress Notes (Signed)
Established Patient Office Visit  Subjective   Patient ID: ZIRA YOHO, female    DOB: Jun 11, 1985  Age: 37 y.o. MRN: [id number]  Chief Complaint  Patient presents with   Obesity   Depression   Flank Pain    Depression       Urinary Tract Infection    Ellizabeth is here for obesity follow up:  Obesity She started Wegovy 2.4 mg 2 weeks ago. She has been doing well with this. She has been eating a healthy diet and has been exercising with her elliptical. She reports some mild nausea in the morning, but denies side effects otherwise.   Wt Readings from Last 3 Encounters:  07/16/22 157 lb 4 oz (71.3 kg)  05/26/22 164 lb 12.8 oz (74.8 kg)  04/30/22 170 lb (77.1 kg)    2. Depression/anxiety Started lexapro at last visit due to anxiety. She reports that anxiety is now well controlled. Denies side effects.     07/16/2022    9:04 AM 04/30/2022   10:29 AM 03/05/2022   10:38 AM  Depression screen PHQ 2/9  Decreased Interest 0 0 0  Down, Depressed, Hopeless 0 0 0  PHQ - 2 Score 0 0 0  Altered sleeping 0 0 0  Tired, decreased energy 0 0 0  Change in appetite 0 0 0  Feeling bad or failure about yourself  0 0 0  Trouble concentrating 0 0 0  Moving slowly or fidgety/restless 0 0 0  Suicidal thoughts 0 0 0  PHQ-9 Score 0 0 0  Difficult doing work/chores Not difficult at all Not difficult at all Not difficult at all      07/16/2022    9:05 AM 04/30/2022   10:30 AM 03/05/2022   10:38 AM 01/22/2022   11:17 AM  GAD 7 : Generalized Anxiety Score  Nervous, Anxious, on Edge 0 0 0 0  Control/stop worrying 0 0 0 0  Worry too much - different things 0 0 0 0  Trouble relaxing 0 0 0 0  Restless 0 0 0 0  Easily annoyed or irritable 0 0 0 0  Afraid - awful might happen 0 0 0 0  Total GAD 7 Score 0 0 0 0  Anxiety Difficulty Not difficult at all Not difficult at all Not difficult at all Not difficult at all    3. Lower back pain Back Pain: Patient presents for presents evaluation of  low back problems.  Symptoms have been present for 2 weeks and include pain in bilateral lower back (aching in character; 4/10 in severity). Initial inciting event: none.  Exacerbating factors identifiable by patient are bending forwards and bending sideways. Treatments so far initiated by patient: none Previous lower back problems: none. Previous workup: none. Previous treatments: none. She is worried about a possible UTI. Denies urinary symptoms, numbness, tingling, changes in bowel or bladder control.     Past Medical History:  Diagnosis Date   Complication of anesthesia    spinal anes- unsuccessful for 1st CS   Depression    Family history of adverse reaction to anesthesia    mother - difficult intubation   Kidney infection 04/2015      Review of Systems  Psychiatric/Behavioral:  Positive for depression.    As per HPI.    Objective:     BP 104/76   Pulse 86   Temp 98.1 F (36.7 C) (Temporal)   Ht '5\' 5"'$  (1.651 m)   Wt 157 lb 4 oz (  71.3 kg)   SpO2 97%   BMI 26.17 kg/m    Physical Exam Vitals and nursing note reviewed.  Constitutional:      General: She is not in acute distress.    Appearance: She is not ill-appearing, toxic-appearing or diaphoretic.  Neck:     Thyroid: No thyroid mass, thyromegaly or thyroid tenderness.  Cardiovascular:     Rate and Rhythm: Normal rate and regular rhythm.     Heart sounds: Normal heart sounds. No murmur heard. Pulmonary:     Effort: Pulmonary effort is normal. No respiratory distress.     Breath sounds: Normal breath sounds.  Musculoskeletal:     Cervical back: Neck supple. No rigidity.     Lumbar back: No swelling, edema, deformity, signs of trauma, tenderness or bony tenderness. Negative right straight leg raise test and negative left straight leg raise test.     Right lower leg: No edema.     Left lower leg: No edema.  Skin:    General: Skin is warm and dry.  Neurological:     General: No focal deficit present.     Mental  Status: She is alert and oriented to person, place, and time.  Psychiatric:        Mood and Affect: Mood normal.        Behavior: Behavior normal.      No results found for any visits on 07/16/22.    The ASCVD Risk score (Arnett DK, et al., 2019) failed to calculate for the following reasons:   The 2019 ASCVD risk score is only valid for ages 53 to 93    Assessment & Plan:   Shateara was seen today for obesity, depression and urinary tract infection.  Diagnoses and all orders for this visit:  Obesity (BMI 30-39.9) Patient's BMI was >30 mg/m2.  Patient's current BMI is Body mass index is 26.17 kg/m.Marland Kitchen  Patient has lost and maintained a 5% body weight loss.  Patient is currently enrolled in a healthy eating plan along with encouraged exercise.   Patient does not have a personal or family history of medullary thyroid carcinoma (MTC) or Multiple Endocrine Neoplasia syndrome type 2 (MEN 2). -     BMP8+EGFR  Recurrent depression (HCC) Anxiety Well controlled on current regimen. Continue lexapro.   Acute bilateral low back pain without sciatica Negative UA. No red flags. Rest, heat, stretching, tylenol, advil.  -     Urinalysis, Routine w reflex microscopic  Tachycardia Established with cardiology. Normal rate today with metoprolol BID. Had recent echo that was normal. Has follow up tomorrow.     Return in about 8 weeks (around 09/10/2022).   The patient indicates understanding of these issues and agrees with the plan.  Gwenlyn Perking, FNP

## 2022-07-17 ENCOUNTER — Encounter: Payer: Self-pay | Admitting: Cardiology

## 2022-07-17 ENCOUNTER — Ambulatory Visit: Payer: 59 | Attending: Cardiology | Admitting: Cardiology

## 2022-07-17 VITALS — BP 112/75 | HR 98 | Ht 65.0 in | Wt 157.0 lb

## 2022-07-17 DIAGNOSIS — R000 Tachycardia, unspecified: Secondary | ICD-10-CM | POA: Diagnosis not present

## 2022-07-17 DIAGNOSIS — I10 Essential (primary) hypertension: Secondary | ICD-10-CM

## 2022-07-17 LAB — BMP8+EGFR
BUN/Creatinine Ratio: 10 (ref 9–23)
BUN: 6 mg/dL (ref 6–20)
CO2: 24 mmol/L (ref 20–29)
Calcium: 9.5 mg/dL (ref 8.7–10.2)
Chloride: 102 mmol/L (ref 96–106)
Creatinine, Ser: 0.62 mg/dL (ref 0.57–1.00)
Glucose: 77 mg/dL (ref 70–99)
Potassium: 4.9 mmol/L (ref 3.5–5.2)
Sodium: 141 mmol/L (ref 134–144)
eGFR: 118 mL/min/{1.73_m2} (ref >59)

## 2022-07-17 NOTE — Patient Instructions (Signed)
Medication Instructions:  Your physician recommends that you continue on your current medications as directed. Please refer to the Current Medication list given to you today.  *If you need a refill on your cardiac medications before your next appointment, please call your pharmacy*  Follow-Up: At [facility], you and your health needs are our priority.  As part of our continuing mission to provide you with exceptional heart care, we have created designated Provider Care Teams.  These Care Teams include your primary Cardiologist (physician) and Advanced Practice Providers (APPs -  Physician Assistants and Nurse Practitioners) who all work together to provide you with the care you need, when you need it.  We recommend signing up for the patient portal called "MyChart".  Sign up information is provided on this After Visit Summary.  MyChart is used to connect with patients for Virtual Visits (Telemedicine).  Patients are able to view lab/test results, encounter notes, upcoming appointments, etc.  Non-urgent messages can be sent to your provider as well.   To learn more about what you can do with MyChart, go to [email or web address].    Your next appointment:   AS NEEDED with Dr. Percival Spanish

## 2022-07-22 ENCOUNTER — Other Ambulatory Visit (HOSPITAL_COMMUNITY): Payer: Self-pay

## 2022-07-29 ENCOUNTER — Other Ambulatory Visit (HOSPITAL_COMMUNITY): Payer: Self-pay

## 2022-08-08 ENCOUNTER — Other Ambulatory Visit (HOSPITAL_COMMUNITY): Payer: Self-pay

## 2022-08-25 ENCOUNTER — Other Ambulatory Visit (HOSPITAL_COMMUNITY): Payer: Self-pay

## 2022-08-27 DIAGNOSIS — L659 Nonscarring hair loss, unspecified: Secondary | ICD-10-CM | POA: Diagnosis not present

## 2022-08-27 DIAGNOSIS — Z130 Encounter for screening for diseases of the blood and blood-forming organs and certain disorders involving the immune mechanism: Secondary | ICD-10-CM | POA: Diagnosis not present

## 2022-08-27 DIAGNOSIS — Z1329 Encounter for screening for other suspected endocrine disorder: Secondary | ICD-10-CM | POA: Diagnosis not present

## 2022-09-10 ENCOUNTER — Encounter: Payer: Self-pay | Admitting: Family Medicine

## 2022-09-10 ENCOUNTER — Other Ambulatory Visit (HOSPITAL_COMMUNITY): Payer: Self-pay

## 2022-09-10 ENCOUNTER — Ambulatory Visit (INDEPENDENT_AMBULATORY_CARE_PROVIDER_SITE_OTHER): Payer: 59 | Admitting: Family Medicine

## 2022-09-10 VITALS — BP 109/63 | HR 92 | Temp 97.6°F | Ht 65.0 in | Wt 150.2 lb

## 2022-09-10 DIAGNOSIS — F339 Major depressive disorder, recurrent, unspecified: Secondary | ICD-10-CM | POA: Diagnosis not present

## 2022-09-10 DIAGNOSIS — F419 Anxiety disorder, unspecified: Secondary | ICD-10-CM | POA: Diagnosis not present

## 2022-09-10 DIAGNOSIS — E669 Obesity, unspecified: Secondary | ICD-10-CM

## 2022-09-10 MED ORDER — ESCITALOPRAM OXALATE 10 MG PO TABS
10.0000 mg | ORAL_TABLET | Freq: Every day | ORAL | 3 refills | Status: DC
  Filled 2022-09-10: qty 90, 90d supply, fill #0
  Filled 2022-12-29: qty 90, 90d supply, fill #1

## 2022-09-10 MED ORDER — HYDROXYZINE HCL 10 MG PO TABS
10.0000 mg | ORAL_TABLET | Freq: Three times a day (TID) | ORAL | 1 refills | Status: DC | PRN
  Filled 2022-09-10: qty 30, 10d supply, fill #0

## 2022-09-10 NOTE — Progress Notes (Signed)
Established Patient Office Visit  Subjective   Patient ID: Amy Jackson, female    DOB: 09-10-1985  Age: 37 y.o. MRN: [id number]  Chief Complaint  Patient presents with   Depression   Obesity    HPI Ronya is here for obesity follow up:  Obesity She started Wegovy 2.4 mg 6 weeks ago. She has been doing well with this. She has been eating a healthy diet and has been exercising with her elliptical. She denies side effect other than hair loss. Her GYN recently ran labs for hormones and thyroid which were normal.   Wt Readings from Last 3 Encounters:  09/10/22 150 lb 4 oz (68.2 kg)  07/17/22 157 lb (71.2 kg)  07/16/22 157 lb 4 oz (71.3 kg)    2. Depression/anxiety She is currently taking lexparo 5 mg daily. She reports irritability and sometimes feeling panicky and overwhelmed. She would like to increase her lexapro.      09/10/2022    9:58 AM 07/16/2022    9:04 AM 04/30/2022   10:29 AM  Depression screen PHQ 2/9  Decreased Interest 0 0 0  Down, Depressed, Hopeless 0 0 0  PHQ - 2 Score 0 0 0  Altered sleeping 0 0 0  Tired, decreased energy 0 0 0  Change in appetite 0 0 0  Feeling bad or failure about yourself  0 0 0  Trouble concentrating 0 0 0  Moving slowly or fidgety/restless 0 0 0  Suicidal thoughts 0 0 0  PHQ-9 Score 0 0 0  Difficult doing work/chores Not difficult at all Not difficult at all Not difficult at all      09/10/2022    9:59 AM 07/16/2022    9:05 AM 04/30/2022   10:30 AM 03/05/2022   10:38 AM  GAD 7 : Generalized Anxiety Score  Nervous, Anxious, on Edge 0 0 0 0  Control/stop worrying 0 0 0 0  Worry too much - different things 0 0 0 0  Trouble relaxing 0 0 0 0  Restless 0 0 0 0  Easily annoyed or irritable 0 0 0 0  Afraid - awful might happen 0 0 0 0  Total GAD 7 Score 0 0 0 0  Anxiety Difficulty Not difficult at all Not difficult at all Not difficult at all Not difficult at all    Past Medical History:  Diagnosis Date   Complication of  anesthesia    spinal anes- unsuccessful for 1st CS   Depression    Family history of adverse reaction to anesthesia    mother - difficult intubation   Kidney infection 04/2015      ROS As per HPI.    Objective:     BP 109/63   Pulse 92   Temp 97.6 F (36.4 C) (Temporal)   Ht  (1.651 m)   Wt 150 lb 4 oz (68.2 kg)   SpO2 99%   BMI 25.00 kg/m    Physical Exam Vitals and nursing note reviewed.  Constitutional:      General: She is not in acute distress.    Appearance: She is not ill-appearing, toxic-appearing or diaphoretic.  Neck:     Thyroid: No thyroid mass, thyromegaly or thyroid tenderness.  Cardiovascular:     Rate and Rhythm: Normal rate and regular rhythm.     Heart sounds: Normal heart sounds. No murmur heard. Pulmonary:     Effort: Pulmonary effort is normal. No respiratory distress.     Breath  sounds: Normal breath sounds.  Musculoskeletal:     Cervical back: Neck supple. No rigidity.     Lumbar back: No swelling, edema, deformity, signs of trauma, tenderness or bony tenderness. Negative right straight leg raise test and negative left straight leg raise test.     Right lower leg: No edema.     Left lower leg: No edema.  Skin:    General: Skin is warm and dry.  Neurological:     General: No focal deficit present.     Mental Status: She is alert and oriented to person, place, and time.  Psychiatric:        Mood and Affect: Mood normal.        Behavior: Behavior normal.      No results found for any visits on 09/10/22.    The ASCVD Risk score (Arnett DK, et al., 2019) failed to calculate for the following reasons:   The 2019 ASCVD risk score is only valid for ages 74 to 42    Assessment & Plan:   Netasha was seen today for depression and obesity.  Diagnoses and all orders for this visit:  Obesity (BMI 30-39.9) BMI is now at 25 with Wegovy, diet, and exercise. Discussed hair loss as side effect of Wegovy. She is planning to finish up her  current fill.   Recurrent depression Anxiety Reports anxiety is not controlled at times. Will increase lexparo and add hydroxyzine. Follow up in 3 months, sooner if symptoms worsen or do not improve.  -     escitalopram (LEXAPRO) 10 MG tablet; Take 1 tablet (10 mg total) by mouth daily. -     hydrOXYzine (ATARAX) 10 MG tablet; Take 1 tablet (10 mg total) by mouth 3 (three) times daily as needed.   Return in about 3 months (around 12/10/2022) for medication follow up.   The patient indicates understanding of these issues and agrees with the plan.  Gabriel Earing, FNP

## 2022-09-11 ENCOUNTER — Other Ambulatory Visit (HOSPITAL_COMMUNITY): Payer: Self-pay

## 2022-09-20 ENCOUNTER — Other Ambulatory Visit (HOSPITAL_COMMUNITY): Payer: Self-pay

## 2022-10-08 ENCOUNTER — Other Ambulatory Visit (HOSPITAL_COMMUNITY): Payer: Self-pay

## 2022-10-08 ENCOUNTER — Encounter (HOSPITAL_COMMUNITY): Payer: Self-pay

## 2022-12-10 ENCOUNTER — Ambulatory Visit: Payer: 59 | Admitting: Family Medicine

## 2022-12-29 ENCOUNTER — Other Ambulatory Visit (HOSPITAL_COMMUNITY): Payer: Self-pay

## 2023-02-05 ENCOUNTER — Other Ambulatory Visit (HOSPITAL_COMMUNITY): Payer: Self-pay

## 2023-04-18 ENCOUNTER — Telehealth: Payer: 59 | Admitting: Family Medicine

## 2023-04-18 ENCOUNTER — Other Ambulatory Visit (HOSPITAL_COMMUNITY): Payer: Self-pay

## 2023-04-18 DIAGNOSIS — J019 Acute sinusitis, unspecified: Secondary | ICD-10-CM

## 2023-04-18 DIAGNOSIS — B9689 Other specified bacterial agents as the cause of diseases classified elsewhere: Secondary | ICD-10-CM | POA: Diagnosis not present

## 2023-04-18 MED ORDER — AMOXICILLIN-POT CLAVULANATE 875-125 MG PO TABS
1.0000 | ORAL_TABLET | Freq: Two times a day (BID) | ORAL | 0 refills | Status: DC
  Filled 2023-04-18: qty 20, 10d supply, fill #0

## 2023-04-18 NOTE — Progress Notes (Signed)
E-Visit for Sinus Problems  We are sorry that you are not feeling well.  Here is how we plan to help!  Based on what you have shared with me it looks like you have sinusitis.  Sinusitis is inflammation and infection in the sinus cavities of the head.  Based on your presentation I believe you most likely have Acute Bacterial Sinusitis.  This is an infection caused by bacteria and is treated with antibiotics. I have prescribed Augmentin 875mg/125mg one tablet twice daily with food, for 7 days. You may use an oral decongestant such as Mucinex D or if you have glaucoma or high blood pressure use plain Mucinex. Saline nasal spray help and can safely be used as often as needed for congestion.  If you develop worsening sinus pain, fever or notice severe headache and vision changes, or if symptoms are not better after completion of antibiotic, please schedule an appointment with a health care provider.    Sinus infections are not as easily transmitted as other respiratory infection, however we still recommend that you avoid close contact with loved ones, especially the very young and elderly.  Remember to wash your hands thoroughly throughout the day as this is the number one way to prevent the spread of infection!  Home Care: Only take medications as instructed by your medical team. Complete the entire course of an antibiotic. Do not take these medications with alcohol. A steam or ultrasonic humidifier can help congestion.  You can place a towel over your head and breathe in the steam from hot water coming from a faucet. Avoid close contacts especially the very young and the elderly. Cover your mouth when you cough or sneeze. Always remember to wash your hands.  Get Help Right Away If: You develop worsening fever or sinus pain. You develop a severe head ache or visual changes. Your symptoms persist after you have completed your treatment plan.  Make sure you Understand these instructions. Will watch  your condition. Will get help right away if you are not doing well or get worse.  Thank you for choosing an e-visit.  Your e-visit answers were reviewed by a board certified advanced clinical practitioner to complete your personal care plan. Depending upon the condition, your plan could have included both over the counter or prescription medications.  Please review your pharmacy choice. Make sure the pharmacy is open so you can pick up prescription now. If there is a problem, you may contact your provider through MyChart messaging and have the prescription routed to another pharmacy.  Your safety is important to us. If you have drug allergies check your prescription carefully.   For the next 24 hours you can use MyChart to ask questions about today's visit, request a non-urgent call back, or ask for a work or school excuse. You will get an email in the next two days asking about your experience. I hope that your e-visit has been valuable and will speed your recovery.    have provided 5 minutes of non face to face time during this encounter for chart review and documentation.   

## 2023-07-30 ENCOUNTER — Other Ambulatory Visit (HOSPITAL_COMMUNITY): Payer: Self-pay

## 2023-07-30 DIAGNOSIS — N925 Other specified irregular menstruation: Secondary | ICD-10-CM | POA: Diagnosis not present

## 2023-07-30 DIAGNOSIS — Z3480 Encounter for supervision of other normal pregnancy, unspecified trimester: Secondary | ICD-10-CM | POA: Diagnosis not present

## 2023-07-30 DIAGNOSIS — Z113 Encounter for screening for infections with a predominantly sexual mode of transmission: Secondary | ICD-10-CM | POA: Diagnosis not present

## 2023-07-30 DIAGNOSIS — Z3201 Encounter for pregnancy test, result positive: Secondary | ICD-10-CM | POA: Diagnosis not present

## 2023-07-30 LAB — HEPATITIS C ANTIBODY: HCV Ab: NEGATIVE

## 2023-07-30 LAB — OB RESULTS CONSOLE HEPATITIS B SURFACE ANTIGEN: Hepatitis B Surface Ag: NEGATIVE

## 2023-07-30 LAB — OB RESULTS CONSOLE GC/CHLAMYDIA
Chlamydia: NEGATIVE
Neisseria Gonorrhea: NEGATIVE

## 2023-07-30 LAB — OB RESULTS CONSOLE HIV ANTIBODY (ROUTINE TESTING): HIV: NONREACTIVE

## 2023-07-30 LAB — OB RESULTS CONSOLE RUBELLA ANTIBODY, IGM: Rubella: IMMUNE

## 2023-07-30 LAB — OB RESULTS CONSOLE RPR: RPR: NONREACTIVE

## 2023-07-30 LAB — OB RESULTS CONSOLE ANTIBODY SCREEN: Antibody Screen: NEGATIVE

## 2023-07-30 MED ORDER — PROMETHAZINE HCL 25 MG PO TABS
25.0000 mg | ORAL_TABLET | Freq: Four times a day (QID) | ORAL | 1 refills | Status: DC
  Filled 2023-07-30: qty 30, 8d supply, fill #0

## 2023-08-14 ENCOUNTER — Other Ambulatory Visit (HOSPITAL_COMMUNITY): Payer: Self-pay

## 2023-08-28 DIAGNOSIS — Z3482 Encounter for supervision of other normal pregnancy, second trimester: Secondary | ICD-10-CM | POA: Diagnosis not present

## 2023-09-22 DIAGNOSIS — Z363 Encounter for antenatal screening for malformations: Secondary | ICD-10-CM | POA: Diagnosis not present

## 2023-09-22 DIAGNOSIS — Z3A19 19 weeks gestation of pregnancy: Secondary | ICD-10-CM | POA: Diagnosis not present

## 2023-10-28 DIAGNOSIS — Z369 Encounter for antenatal screening, unspecified: Secondary | ICD-10-CM | POA: Diagnosis not present

## 2023-10-28 DIAGNOSIS — Z3A24 24 weeks gestation of pregnancy: Secondary | ICD-10-CM | POA: Diagnosis not present

## 2023-10-28 DIAGNOSIS — Z3492 Encounter for supervision of normal pregnancy, unspecified, second trimester: Secondary | ICD-10-CM | POA: Diagnosis not present

## 2023-11-24 ENCOUNTER — Other Ambulatory Visit (HOSPITAL_COMMUNITY): Payer: Self-pay

## 2023-11-25 DIAGNOSIS — Z369 Encounter for antenatal screening, unspecified: Secondary | ICD-10-CM | POA: Diagnosis not present

## 2023-11-25 DIAGNOSIS — Z3480 Encounter for supervision of other normal pregnancy, unspecified trimester: Secondary | ICD-10-CM | POA: Diagnosis not present

## 2023-12-11 DIAGNOSIS — O99810 Abnormal glucose complicating pregnancy: Secondary | ICD-10-CM | POA: Diagnosis not present

## 2023-12-14 ENCOUNTER — Other Ambulatory Visit (HOSPITAL_COMMUNITY): Payer: Self-pay

## 2023-12-14 MED ORDER — FREESTYLE LITE W/DEVICE KIT
PACK | 0 refills | Status: DC
  Filled 2023-12-14: qty 1, 30d supply, fill #0

## 2023-12-14 MED ORDER — FREESTYLE LANCETS MISC
5 refills | Status: DC
  Filled 2023-12-14 – 2024-01-02 (×2): qty 100, 25d supply, fill #0
  Filled 2024-01-04: qty 100, 25d supply, fill #1

## 2023-12-14 MED ORDER — GLUCOSE BLOOD VI STRP
ORAL_STRIP | Freq: Four times a day (QID) | 5 refills | Status: DC
  Filled 2023-12-14 – 2024-01-02 (×2): qty 100, 25d supply, fill #0
  Filled 2024-01-04: qty 100, 25d supply, fill #1
  Filled 2024-02-01: qty 100, 25d supply, fill #2

## 2023-12-16 ENCOUNTER — Encounter: Attending: Obstetrics and Gynecology | Admitting: Dietician

## 2023-12-16 NOTE — Progress Notes (Signed)
 Patient was seen on 12/16/2023 for Gestational Diabetes self-management class at the [facility]. The following learning objectives were met by the patient during this course:  States the definition of Gestational Diabetes States why dietary management is important in controlling blood glucose Describes the effects each nutrient has on blood glucose levels Demonstrates ability to create a balanced meal plan Demonstrates carbohydrate counting  States when to check blood glucose levels Demonstrates proper blood glucose monitoring techniques States the effect of stress and exercise on blood glucose levels States the importance of limiting caffeine and abstaining from alcohol and smoking   Patient has a meter prior to visit. Patient is instructed to test pre breakfast and 2 hours after each meal. Blood glucose today in class 119 mg/dL, reported as 2 hour post prandial per Pt  Patient instructed to monitor glucose levels:  QID FBS: 60 - <95 2 hour: <120  *Patient received handouts: Nutrition Diabetes and Pregnancy Carbohydrate Counting List Blood glucose log Snack ideas for diabetes during pregnancy Plate Planner  Patient will be seen for follow-up as needed.  No questionnaires on file.

## 2023-12-24 ENCOUNTER — Other Ambulatory Visit (HOSPITAL_COMMUNITY): Payer: Self-pay

## 2023-12-24 DIAGNOSIS — O26843 Uterine size-date discrepancy, third trimester: Secondary | ICD-10-CM | POA: Diagnosis not present

## 2023-12-24 DIAGNOSIS — O24414 Gestational diabetes mellitus in pregnancy, insulin controlled: Secondary | ICD-10-CM | POA: Diagnosis not present

## 2023-12-24 DIAGNOSIS — O26849 Uterine size-date discrepancy, unspecified trimester: Secondary | ICD-10-CM | POA: Diagnosis not present

## 2023-12-24 DIAGNOSIS — Z3A32 32 weeks gestation of pregnancy: Secondary | ICD-10-CM | POA: Diagnosis not present

## 2023-12-24 MED ORDER — BD PEN NEEDLE SHORT ULTRAFINE 31G X 8 MM MISC
0 refills | Status: DC
  Filled 2023-12-24: qty 100, 25d supply, fill #0

## 2023-12-24 MED ORDER — INSULIN GLARGINE-YFGN 100 UNIT/ML ~~LOC~~ SOPN
PEN_INJECTOR | SUBCUTANEOUS | 1 refills | Status: DC
  Filled 2023-12-24: qty 6, 30d supply, fill #0
  Filled 2024-01-25: qty 6, 30d supply, fill #1

## 2023-12-25 ENCOUNTER — Other Ambulatory Visit (HOSPITAL_COMMUNITY): Payer: Self-pay

## 2023-12-25 MED ORDER — INSULIN GLARGINE-YFGN 100 UNIT/ML ~~LOC~~ SOPN: PEN_INJECTOR | SUBCUTANEOUS | 1 refills | Status: DC

## 2023-12-28 DIAGNOSIS — Z23 Encounter for immunization: Secondary | ICD-10-CM | POA: Diagnosis not present

## 2023-12-28 DIAGNOSIS — O24414 Gestational diabetes mellitus in pregnancy, insulin controlled: Secondary | ICD-10-CM | POA: Diagnosis not present

## 2024-01-02 ENCOUNTER — Other Ambulatory Visit (HOSPITAL_COMMUNITY): Payer: Self-pay

## 2024-01-02 ENCOUNTER — Other Ambulatory Visit (HOSPITAL_BASED_OUTPATIENT_CLINIC_OR_DEPARTMENT_OTHER): Payer: Self-pay

## 2024-01-04 ENCOUNTER — Other Ambulatory Visit (HOSPITAL_COMMUNITY): Payer: Self-pay

## 2024-01-04 DIAGNOSIS — O24414 Gestational diabetes mellitus in pregnancy, insulin controlled: Secondary | ICD-10-CM | POA: Diagnosis not present

## 2024-01-04 DIAGNOSIS — Z3A34 34 weeks gestation of pregnancy: Secondary | ICD-10-CM | POA: Diagnosis not present

## 2024-01-13 DIAGNOSIS — O24414 Gestational diabetes mellitus in pregnancy, insulin controlled: Secondary | ICD-10-CM | POA: Diagnosis not present

## 2024-01-13 DIAGNOSIS — Z3A35 35 weeks gestation of pregnancy: Secondary | ICD-10-CM | POA: Diagnosis not present

## 2024-01-19 DIAGNOSIS — Z3A36 36 weeks gestation of pregnancy: Secondary | ICD-10-CM | POA: Diagnosis not present

## 2024-01-19 DIAGNOSIS — O24419 Gestational diabetes mellitus in pregnancy, unspecified control: Secondary | ICD-10-CM | POA: Diagnosis not present

## 2024-01-19 DIAGNOSIS — Z3480 Encounter for supervision of other normal pregnancy, unspecified trimester: Secondary | ICD-10-CM | POA: Diagnosis not present

## 2024-01-22 DIAGNOSIS — Z3A36 36 weeks gestation of pregnancy: Secondary | ICD-10-CM | POA: Diagnosis not present

## 2024-01-22 DIAGNOSIS — O24414 Gestational diabetes mellitus in pregnancy, insulin controlled: Secondary | ICD-10-CM | POA: Diagnosis not present

## 2024-01-26 DIAGNOSIS — O24415 Gestational diabetes mellitus in pregnancy, controlled by oral hypoglycemic drugs: Secondary | ICD-10-CM | POA: Diagnosis not present

## 2024-01-26 DIAGNOSIS — Z3A37 37 weeks gestation of pregnancy: Secondary | ICD-10-CM | POA: Diagnosis not present

## 2024-01-28 ENCOUNTER — Encounter (HOSPITAL_COMMUNITY): Payer: Self-pay | Admitting: *Deleted

## 2024-01-28 ENCOUNTER — Telehealth (HOSPITAL_COMMUNITY): Payer: Self-pay | Admitting: *Deleted

## 2024-01-28 ENCOUNTER — Encounter (HOSPITAL_COMMUNITY): Payer: Self-pay

## 2024-01-28 DIAGNOSIS — O24414 Gestational diabetes mellitus in pregnancy, insulin controlled: Secondary | ICD-10-CM | POA: Diagnosis not present

## 2024-01-28 DIAGNOSIS — Z3A37 37 weeks gestation of pregnancy: Secondary | ICD-10-CM | POA: Diagnosis not present

## 2024-01-28 NOTE — Telephone Encounter (Signed)
 Preadmission screen

## 2024-01-29 ENCOUNTER — Encounter (HOSPITAL_COMMUNITY): Payer: Self-pay

## 2024-01-29 NOTE — Patient Instructions (Signed)
 RIDA LOUDIN  01/29/2024   Your procedure is scheduled on:  02/08/2024  Arrive at 1000 at Entrance C on [address] at [facility]  and [employer]. You are invited to use the FREE valet parking or use the Visitor's parking deck.  Pick up the phone at the desk and dial [phone].  Call this number if you have problems the morning of surgery: [phone]  Remember:   Do not eat food:(After Midnight) Desps de medianoche.  You may drink clear liquids until  __0800___.  Clear liquids means a liquid you can see thru.  It can have color such as Cola or Kool aid.  Tea is OK and coffee as long as no milk or creamer of any kind.  Take these medicines the morning of surgery with A SIP OF WATER:  Take half of the prescribed insulin  the night before surgery and no insulin  on day of surgery   Do not wear jewelry, make-up or nail polish.  Do not wear lotions, powders, or perfumes. Do not wear deodorant.  Do not shave 48 hours prior to surgery.  Do not bring valuables to the hospital.  [facility] is not   responsible for any belongings or valuables brought to the hospital.  Contacts, dentures or bridgework may not be worn into surgery.  Leave suitcase in the car. After surgery it may be brought to your room.  For patients admitted to the hospital, checkout time is 11:00 AM the day of              discharge.      Please read over the following fact sheets that you were given:     Preparing for Surgery

## 2024-01-31 ENCOUNTER — Other Ambulatory Visit: Payer: Self-pay | Admitting: Obstetrics and Gynecology

## 2024-01-31 DIAGNOSIS — Z3490 Encounter for supervision of normal pregnancy, unspecified, unspecified trimester: Secondary | ICD-10-CM

## 2024-02-01 ENCOUNTER — Other Ambulatory Visit (HOSPITAL_COMMUNITY): Payer: Self-pay

## 2024-02-01 DIAGNOSIS — Z3A38 38 weeks gestation of pregnancy: Secondary | ICD-10-CM | POA: Diagnosis not present

## 2024-02-01 DIAGNOSIS — O24419 Gestational diabetes mellitus in pregnancy, unspecified control: Secondary | ICD-10-CM | POA: Diagnosis not present

## 2024-02-01 DIAGNOSIS — O24415 Gestational diabetes mellitus in pregnancy, controlled by oral hypoglycemic drugs: Secondary | ICD-10-CM | POA: Diagnosis not present

## 2024-02-01 MED ORDER — FLUZONE 0.5 ML IM SUSY
0.5000 mL | PREFILLED_SYRINGE | Freq: Once | INTRAMUSCULAR | 0 refills | Status: AC
  Filled 2024-02-01: qty 0.5, 1d supply, fill #0

## 2024-02-05 ENCOUNTER — Encounter (HOSPITAL_COMMUNITY)
Admission: RE | Admit: 2024-02-05 | Discharge: 2024-02-05 | Disposition: A | Discharge status: Home / Self Care | Source: Ambulatory Visit | Attending: Obstetrics and Gynecology | Admitting: Obstetrics and Gynecology

## 2024-02-05 DIAGNOSIS — Z01812 Encounter for preprocedural laboratory examination: Secondary | ICD-10-CM | POA: Insufficient documentation

## 2024-02-05 DIAGNOSIS — Z3A38 38 weeks gestation of pregnancy: Secondary | ICD-10-CM | POA: Diagnosis not present

## 2024-02-05 DIAGNOSIS — Z3490 Encounter for supervision of normal pregnancy, unspecified, unspecified trimester: Secondary | ICD-10-CM

## 2024-02-05 DIAGNOSIS — O24419 Gestational diabetes mellitus in pregnancy, unspecified control: Secondary | ICD-10-CM | POA: Diagnosis not present

## 2024-02-05 HISTORY — DX: Gestational diabetes mellitus in pregnancy, unspecified control: O24.419

## 2024-02-05 HISTORY — DX: Anxiety disorder, unspecified: F41.9

## 2024-02-05 LAB — CBC
HCT: 34.5 % — ABNORMAL LOW (ref 36.0–46.0)
Hemoglobin: 11.6 g/dL — ABNORMAL LOW (ref 12.0–15.0)
MCH: 28.7 pg (ref 26.0–34.0)
MCHC: 33.6 g/dL (ref 30.0–36.0)
MCV: 85.4 fL (ref 80.0–100.0)
Platelets: 139 K/uL — ABNORMAL LOW (ref 150–400)
RBC: 4.04 MIL/uL (ref 3.87–5.11)
RDW: 14.7 % (ref 11.5–15.5)
WBC: 7.8 K/uL (ref 4.0–10.5)
nRBC: 0 % (ref 0.0–0.2)

## 2024-02-05 LAB — TYPE AND SCREEN
ABO/RH(D): A POS
Antibody Screen: NEGATIVE

## 2024-02-05 LAB — RPR: RPR Ser Ql: NONREACTIVE

## 2024-02-07 ENCOUNTER — Encounter (HOSPITAL_COMMUNITY): Payer: Self-pay | Admitting: Obstetrics and Gynecology

## 2024-02-07 NOTE — Anesthesia Preprocedure Evaluation (Signed)
 Anesthesia Evaluation  Patient identified by MRN, date of birth, ID band Patient awake    Reviewed: Allergy & Precautions, NPO status , Patient's Chart, lab work & pertinent test results  History of Anesthesia Complications (+) Family history of anesthesia reaction and history of anesthetic complications (mother was difficult intubation, failed spinal for first c-section)  Airway Mallampati: III  TM Distance: >3 FB Neck ROM: Full    Dental  (+) Partial Upper   Pulmonary neg pulmonary ROS   Pulmonary exam normal breath sounds clear to auscultation       Cardiovascular (-) hypertension(-) angina (-) Past MI, (-) Cardiac Stents and (-) CABG (-) dysrhythmias  Rhythm:Regular Rate:Normal  TTE 06/06/2022: IMPRESSIONS    1. Left ventricular ejection fraction, by estimation, is 60 to 65%. The  left ventricle has normal function. The left ventricle has no regional  wall motion abnormalities. Left ventricular diastolic parameters were  normal.   2. Right ventricular systolic function is normal. The right ventricular  size is normal.   3. The mitral valve is normal in structure. No evidence of mitral valve  regurgitation. No evidence of mitral stenosis.   4. The aortic valve is normal in structure. Aortic valve regurgitation is  not visualized. No aortic stenosis is present.   5. The inferior vena cava is normal in size with greater than 50%  respiratory variability, suggesting right atrial pressure of 3 mmHg.     Neuro/Psych  PSYCHIATRIC DISORDERS Anxiety Depression    negative neurological ROS     GI/Hepatic negative GI ROS, Neg liver ROS,,,  Endo/Other  diabetes, Gestational, Insulin  Dependent    Renal/GU negative Renal ROS     Musculoskeletal   Abdominal   Peds  Hematology  (+) Blood dyscrasia (thromboctyopenia), anemia Lab Results      Component                Value               Date                      WBC                       7.8                 02/05/2024                HGB                      11.6 (L)            02/05/2024                HCT                      34.5 (L)            02/05/2024                MCV                      85.4                02/05/2024                PLT                      139 (L)  02/05/2024              Anesthesia Other Findings   Reproductive/Obstetrics (+) Pregnancy Previous c-section x2                              Anesthesia Physical Anesthesia Plan  ASA: 2  Anesthesia Plan: Spinal   Post-op Pain Management:    Induction:   PONV Risk Score and Plan: 2 and Ondansetron , Dexamethasone  and Treatment may vary due to age or medical condition  Airway Management Planned: Natural Airway  Additional Equipment:   Intra-op Plan:   Post-operative Plan:   Informed Consent: I have reviewed the patients History and Physical, chart, labs and discussed the procedure including the risks, benefits and alternatives for the proposed anesthesia with the patient or authorized representative who has indicated his/her understanding and acceptance.       Plan Discussed with: CRNA and Anesthesiologist  Anesthesia Plan Comments: (I have discussed risks of neuraxial anesthesia including but not limited to infection, bleeding, nerve injury, back pain, headache, seizures, and failure of block. Patient denies bleeding disorders and is not currently anticoagulated. Labs have been reviewed. Risks and benefits discussed. All patient's questions answered.  )         Anesthesia Quick Evaluation

## 2024-02-08 ENCOUNTER — Encounter (HOSPITAL_COMMUNITY): Payer: Self-pay | Admitting: Obstetrics and Gynecology

## 2024-02-08 ENCOUNTER — Encounter (HOSPITAL_COMMUNITY): Payer: Self-pay

## 2024-02-08 ENCOUNTER — Inpatient Hospital Stay (HOSPITAL_COMMUNITY)
Admission: RE | Admit: 2024-02-08 | Discharge: 2024-02-12 | DRG: 784 | Disposition: A | Discharge status: Home / Self Care | Attending: Obstetrics and Gynecology | Admitting: Obstetrics and Gynecology

## 2024-02-08 ENCOUNTER — Inpatient Hospital Stay (HOSPITAL_COMMUNITY): Payer: Self-pay | Admitting: Anesthesiology

## 2024-02-08 ENCOUNTER — Encounter (HOSPITAL_COMMUNITY)
Admission: RE | Disposition: A | Discharge status: Home / Self Care | Payer: Self-pay | Source: Home / Self Care | Attending: Obstetrics and Gynecology

## 2024-02-08 ENCOUNTER — Other Ambulatory Visit: Payer: Self-pay

## 2024-02-08 DIAGNOSIS — O134 Gestational [pregnancy-induced] hypertension without significant proteinuria, complicating childbirth: Secondary | ICD-10-CM | POA: Diagnosis not present

## 2024-02-08 DIAGNOSIS — Z3A39 39 weeks gestation of pregnancy: Secondary | ICD-10-CM | POA: Diagnosis not present

## 2024-02-08 DIAGNOSIS — D62 Acute posthemorrhagic anemia: Secondary | ICD-10-CM | POA: Diagnosis not present

## 2024-02-08 DIAGNOSIS — O24424 Gestational diabetes mellitus in childbirth, insulin controlled: Secondary | ICD-10-CM | POA: Diagnosis present

## 2024-02-08 DIAGNOSIS — R002 Palpitations: Secondary | ICD-10-CM | POA: Diagnosis not present

## 2024-02-08 DIAGNOSIS — Z302 Encounter for sterilization: Secondary | ICD-10-CM | POA: Diagnosis not present

## 2024-02-08 DIAGNOSIS — F419 Anxiety disorder, unspecified: Secondary | ICD-10-CM | POA: Diagnosis present

## 2024-02-08 DIAGNOSIS — O9081 Anemia of the puerperium: Secondary | ICD-10-CM | POA: Diagnosis not present

## 2024-02-08 DIAGNOSIS — Z8249 Family history of ischemic heart disease and other diseases of the circulatory system: Secondary | ICD-10-CM

## 2024-02-08 DIAGNOSIS — Z833 Family history of diabetes mellitus: Secondary | ICD-10-CM | POA: Diagnosis not present

## 2024-02-08 DIAGNOSIS — O24429 Gestational diabetes mellitus in childbirth, unspecified control: Secondary | ICD-10-CM

## 2024-02-08 DIAGNOSIS — Z3490 Encounter for supervision of normal pregnancy, unspecified, unspecified trimester: Principal | ICD-10-CM

## 2024-02-08 DIAGNOSIS — O99344 Other mental disorders complicating childbirth: Secondary | ICD-10-CM | POA: Diagnosis not present

## 2024-02-08 DIAGNOSIS — O34219 Maternal care for unspecified type scar from previous cesarean delivery: Principal | ICD-10-CM

## 2024-02-08 DIAGNOSIS — O34211 Maternal care for low transverse scar from previous cesarean delivery: Principal | ICD-10-CM | POA: Diagnosis present

## 2024-02-08 LAB — CBC
HCT: 33.3 % — ABNORMAL LOW (ref 36.0–46.0)
Hemoglobin: 11.2 g/dL — ABNORMAL LOW (ref 12.0–15.0)
MCH: 28.6 pg (ref 26.0–34.0)
MCHC: 33.6 g/dL (ref 30.0–36.0)
MCV: 85.2 fL (ref 80.0–100.0)
Platelets: 145 K/uL — ABNORMAL LOW (ref 150–400)
RBC: 3.91 MIL/uL (ref 3.87–5.11)
RDW: 14.6 % (ref 11.5–15.5)
WBC: 12.1 K/uL — ABNORMAL HIGH (ref 4.0–10.5)
nRBC: 0 % (ref 0.0–0.2)

## 2024-02-08 LAB — GLUCOSE, CAPILLARY
Glucose-Capillary: 71 mg/dL (ref 70–99)
Glucose-Capillary: 76 mg/dL (ref 70–99)

## 2024-02-08 SURGERY — Surgical Case
Anesthesia: Spinal

## 2024-02-08 MED ORDER — DIPHENHYDRAMINE HCL 25 MG PO CAPS
25.0000 mg | ORAL_CAPSULE | Freq: Four times a day (QID) | ORAL | Status: DC | PRN
  Administered 2024-02-09: 25 mg via ORAL
  Filled 2024-02-08: qty 1

## 2024-02-08 MED ORDER — SENNOSIDES-DOCUSATE SODIUM 8.6-50 MG PO TABS
2.0000 | ORAL_TABLET | Freq: Every day | ORAL | Status: DC
  Administered 2024-02-09 – 2024-02-12 (×4): 2 via ORAL
  Filled 2024-02-08 (×4): qty 2

## 2024-02-08 MED ORDER — COCONUT OIL OIL
1.0000 | TOPICAL_OIL | Status: DC | PRN
  Administered 2024-02-09: 1 via TOPICAL

## 2024-02-08 MED ORDER — POVIDONE-IODINE 10 % EX SWAB
2.0000 | Freq: Once | CUTANEOUS | Status: AC
  Administered 2024-02-08: 2 via TOPICAL

## 2024-02-08 MED ORDER — SIMETHICONE 80 MG PO CHEW
80.0000 mg | CHEWABLE_TABLET | Freq: Three times a day (TID) | ORAL | Status: DC
  Administered 2024-02-09 – 2024-02-12 (×7): 80 mg via ORAL
  Filled 2024-02-08 (×7): qty 1

## 2024-02-08 MED ORDER — ACETAMINOPHEN 500 MG PO TABS
1000.0000 mg | ORAL_TABLET | Freq: Four times a day (QID) | ORAL | Status: AC
  Administered 2024-02-08 – 2024-02-09 (×3): 1000 mg via ORAL
  Filled 2024-02-08 (×2): qty 2

## 2024-02-08 MED ORDER — FENTANYL CITRATE (PF) 100 MCG/2ML IJ SOLN
INTRAMUSCULAR | Status: DC | PRN
  Administered 2024-02-08: 100 ug via INTRATHECAL

## 2024-02-08 MED ORDER — CEFAZOLIN SODIUM-DEXTROSE 2-4 GM/100ML-% IV SOLN
INTRAVENOUS | Status: AC
  Filled 2024-02-08: qty 100

## 2024-02-08 MED ORDER — WITCH HAZEL-GLYCERIN EX PADS: 1.0000 | MEDICATED_PAD | CUTANEOUS | Status: DC | PRN

## 2024-02-08 MED ORDER — TRANEXAMIC ACID-NACL 1000-0.7 MG/100ML-% IV SOLN
1000.0000 mg | Freq: Once | INTRAVENOUS | Status: AC
  Administered 2024-02-08: 1000 mg via INTRAVENOUS

## 2024-02-08 MED ORDER — ACETAMINOPHEN 10 MG/ML IV SOLN
INTRAVENOUS | Status: AC
  Filled 2024-02-08: qty 100

## 2024-02-08 MED ORDER — DIPHENHYDRAMINE HCL 25 MG PO CAPS: 25.0000 mg | ORAL_CAPSULE | Freq: Four times a day (QID) | ORAL | Status: DC | PRN

## 2024-02-08 MED ORDER — MORPHINE SULFATE (PF) 0.5 MG/ML IJ SOLN
INTRAMUSCULAR | Status: DC | PRN
  Administered 2024-02-08: 150 ug via INTRATHECAL

## 2024-02-08 MED ORDER — NALOXONE HCL 0.4 MG/ML IJ SOLN: 0.4000 mg | INTRAMUSCULAR | Status: DC | PRN

## 2024-02-08 MED ORDER — PROMETHAZINE (PHENERGAN) 6.25MG IN NS 50ML IVPB
6.2500 mg | INTRAVENOUS | Status: DC | PRN
  Administered 2024-02-08: 6.25 mg via INTRAVENOUS
  Filled 2024-02-08: qty 6.25

## 2024-02-08 MED ORDER — ESMOLOL HCL 100 MG/10ML IV SOLN
INTRAVENOUS | Status: DC | PRN
  Administered 2024-02-08 (×2): 10 mg via INTRAVENOUS

## 2024-02-08 MED ORDER — FENTANYL CITRATE (PF) 100 MCG/2ML IJ SOLN: 25.0000 ug | INTRAMUSCULAR | Status: DC | PRN

## 2024-02-08 MED ORDER — CEFAZOLIN SODIUM-DEXTROSE 2-4 GM/100ML-% IV SOLN
2.0000 g | INTRAVENOUS | Status: AC
Start: 2024-02-08 — End: 2024-02-08
  Administered 2024-02-08: 2 g via INTRAVENOUS

## 2024-02-08 MED ORDER — PRENATAL MULTIVITAMIN CH
1.0000 | ORAL_TABLET | Freq: Every day | ORAL | Status: DC
  Administered 2024-02-09 – 2024-02-12 (×3): 1 via ORAL
  Filled 2024-02-08 (×3): qty 1

## 2024-02-08 MED ORDER — OXYTOCIN-SODIUM CHLORIDE 30-0.9 UT/500ML-% IV SOLN
INTRAVENOUS | Status: AC
  Filled 2024-02-08: qty 500

## 2024-02-08 MED ORDER — PHENYLEPHRINE HCL-NACL 20-0.9 MG/250ML-% IV SOLN
INTRAVENOUS | Status: AC
  Filled 2024-02-08: qty 250

## 2024-02-08 MED ORDER — ONDANSETRON HCL 4 MG/2ML IJ SOLN
INTRAMUSCULAR | Status: AC
  Filled 2024-02-08: qty 2

## 2024-02-08 MED ORDER — ACETAMINOPHEN 10 MG/ML IV SOLN
INTRAVENOUS | Status: DC | PRN
  Administered 2024-02-08: 1000 mg via INTRAVENOUS

## 2024-02-08 MED ORDER — DEXAMETHASONE SODIUM PHOSPHATE 10 MG/ML IJ SOLN
INTRAMUSCULAR | Status: DC | PRN
  Administered 2024-02-08 (×2): 5 mg via INTRAVENOUS

## 2024-02-08 MED ORDER — KETOROLAC TROMETHAMINE 30 MG/ML IJ SOLN
30.0000 mg | Freq: Four times a day (QID) | INTRAMUSCULAR | Status: AC | PRN
  Administered 2024-02-08 – 2024-02-09 (×2): 30 mg via INTRAVENOUS
  Filled 2024-02-08 (×2): qty 1

## 2024-02-08 MED ORDER — FENTANYL CITRATE (PF) 100 MCG/2ML IJ SOLN
INTRAMUSCULAR | Status: AC
  Filled 2024-02-08: qty 2

## 2024-02-08 MED ORDER — MENTHOL 3 MG MT LOZG: 1.0000 | LOZENGE | OROMUCOSAL | Status: DC | PRN

## 2024-02-08 MED ORDER — DIBUCAINE (PERIANAL) 1 % EX OINT: 1.0000 | TOPICAL_OINTMENT | CUTANEOUS | Status: DC | PRN

## 2024-02-08 MED ORDER — PHENYLEPHRINE HCL-NACL 20-0.9 MG/250ML-% IV SOLN
INTRAVENOUS | Status: DC | PRN
  Administered 2024-02-08: 60 ug/min via INTRAVENOUS

## 2024-02-08 MED ORDER — TRANEXAMIC ACID-NACL 1000-0.7 MG/100ML-% IV SOLN
INTRAVENOUS | Status: AC
  Filled 2024-02-08: qty 100

## 2024-02-08 MED ORDER — LACTATED RINGERS IV SOLN: INTRAVENOUS | Status: DC

## 2024-02-08 MED ORDER — KETOROLAC TROMETHAMINE 30 MG/ML IJ SOLN
30.0000 mg | Freq: Once | INTRAMUSCULAR | Status: AC | PRN
  Administered 2024-02-08: 30 mg via INTRAVENOUS

## 2024-02-08 MED ORDER — OXYTOCIN-SODIUM CHLORIDE 30-0.9 UT/500ML-% IV SOLN
2.5000 [IU]/h | INTRAVENOUS | Status: AC
  Administered 2024-02-08 (×2): 2.5 [IU]/h via INTRAVENOUS
  Filled 2024-02-08: qty 500

## 2024-02-08 MED ORDER — ONDANSETRON HCL 4 MG/2ML IJ SOLN: 4.0000 mg | Freq: Three times a day (TID) | INTRAMUSCULAR | Status: DC | PRN

## 2024-02-08 MED ORDER — BUPIVACAINE IN DEXTROSE 0.75-8.25 % IT SOLN
INTRATHECAL | Status: DC | PRN
  Administered 2024-02-08: 1.6 mL via INTRATHECAL

## 2024-02-08 MED ORDER — MORPHINE SULFATE (PF) 0.5 MG/ML IJ SOLN
INTRAMUSCULAR | Status: AC
  Filled 2024-02-08: qty 10

## 2024-02-08 MED ORDER — ZOLPIDEM TARTRATE 5 MG PO TABS: 5.0000 mg | ORAL_TABLET | Freq: Every evening | ORAL | Status: DC | PRN

## 2024-02-08 MED ORDER — KETOROLAC TROMETHAMINE 30 MG/ML IJ SOLN: 30.0000 mg | Freq: Four times a day (QID) | INTRAMUSCULAR | Status: AC | PRN

## 2024-02-08 MED ORDER — ONDANSETRON HCL 4 MG/2ML IJ SOLN
INTRAMUSCULAR | Status: DC | PRN
Start: 2024-02-08 — End: 2024-02-08
  Administered 2024-02-08: 4 mg via INTRAVENOUS

## 2024-02-08 MED ORDER — SIMETHICONE 80 MG PO CHEW: 80.0000 mg | CHEWABLE_TABLET | ORAL | Status: DC | PRN

## 2024-02-08 MED ORDER — SODIUM CHLORIDE 0.9% FLUSH: 3.0000 mL | INTRAVENOUS | Status: DC | PRN

## 2024-02-08 MED ORDER — ESMOLOL HCL 100 MG/10ML IV SOLN
INTRAVENOUS | Status: AC
  Filled 2024-02-08: qty 10

## 2024-02-08 MED ORDER — DIPHENHYDRAMINE HCL 50 MG/ML IJ SOLN
12.5000 mg | Freq: Four times a day (QID) | INTRAMUSCULAR | Status: DC | PRN
  Administered 2024-02-08: 12.5 mg via INTRAVENOUS
  Filled 2024-02-08: qty 1

## 2024-02-08 MED ORDER — KETOROLAC TROMETHAMINE 30 MG/ML IJ SOLN
INTRAMUSCULAR | Status: AC
  Filled 2024-02-08: qty 1

## 2024-02-08 SURGICAL SUPPLY — 31 items
CHLORAPREP W/TINT 26ML (MISCELLANEOUS) ×2 IMPLANT
CLAMP UMBILICAL CORD (MISCELLANEOUS) ×1 IMPLANT
CLOTH BEACON ORANGE TIMEOUT ST (SAFETY) ×1 IMPLANT
DERMABOND ADVANCED .7 DNX12 (GAUZE/BANDAGES/DRESSINGS) IMPLANT
DRSG OPSITE POSTOP 4X10 (GAUZE/BANDAGES/DRESSINGS) ×1 IMPLANT
ELECTRODE REM PT RTRN 9FT ADLT (ELECTROSURGICAL) ×1 IMPLANT
EXTRACTOR VACUUM KIWI (MISCELLANEOUS) IMPLANT
GLOVE BIO SURGEON STRL SZ7 (GLOVE) ×1 IMPLANT
GLOVE BIOGEL PI IND STRL 7.0 (GLOVE) ×2 IMPLANT
GOWN STRL REUS W/TWL LRG LVL3 (GOWN DISPOSABLE) ×2 IMPLANT
KIT ABG SYR 3ML LUER SLIP (SYRINGE) IMPLANT
NDL HYPO 25X5/8 SAFETYGLIDE (NEEDLE) IMPLANT
NEEDLE HYPO 22GX1.5 SAFETY (NEEDLE) IMPLANT
NEEDLE HYPO 25X5/8 SAFETYGLIDE (NEEDLE) IMPLANT
NS IRRIG 1000ML POUR BTL (IV SOLUTION) ×1 IMPLANT
PACK C SECTION WH (CUSTOM PROCEDURE TRAY) ×1 IMPLANT
PAD OB MATERNITY 4.3X12.25 (PERSONAL CARE ITEMS) ×1 IMPLANT
RETAINER VISCERAL (MISCELLANEOUS) IMPLANT
RTRCTR C-SECT PINK 25CM LRG (MISCELLANEOUS) ×1 IMPLANT
SPONGE LAP 18X18 X RAY DECT (DISPOSABLE) IMPLANT
SUT CHROMIC 1 CTX 36 (SUTURE) ×2 IMPLANT
SUT CHROMIC 2 0 CT 1 (SUTURE) ×1 IMPLANT
SUT PDS AB 0 CTX 60 (SUTURE) ×1 IMPLANT
SUT VIC AB 2-0 CT1 TAPERPNT 27 (SUTURE) ×1 IMPLANT
SUT VIC AB 4-0 KS 27 (SUTURE) IMPLANT
SUT VIC AB 4-0 SH 27XANBCTRL (SUTURE) IMPLANT
SUTURE PLAIN GUT 2.0 ETHICON (SUTURE) IMPLANT
SYR 30ML LL (SYRINGE) IMPLANT
TOWEL OR 17X24 6PK STRL BLUE (TOWEL DISPOSABLE) ×1 IMPLANT
TRAY FOLEY W/BAG SLVR 14FR LF (SET/KITS/TRAYS/PACK) ×1 IMPLANT
WATER STERILE IRR 1000ML POUR (IV SOLUTION) ×1 IMPLANT

## 2024-02-08 NOTE — Op Note (Signed)
 Operative Report   Pre-Operative Diagnosis: 1) 39+2-week intrauterine pregnancy 2) history of prior C-section x 2, desired repeat 3) A2 gestational diabetes 4) advanced maternal age 38).  Desires permanent sterilization  Postoperative Diagnosis: 1) 39+2-week intrauterine pregnancy 2) history of prior C-section x 2, desired repeat 3) A2 gestational diabetes 4) advanced maternal age 38).  Desires permanent sterilization  Procedure: Repeat low-transverse cesarean section and bilateral salpingectomy  Surgeon: Dr. Marjorie Gull  Assistant:Dr. Rubie Husky  Antibiotics: 2 g Ancef   Operative Findings: Vigorous female infant in the vertex presentation with Apgars of 8 at 1 minute and 8 at 5 minutes.  Normal-appearing ovaries and tubes.  Birth weight 3770 g, 8 pounds 5 ounces  Specimen: Placenta for disposal.  Fallopian tubes to pathology  ZAO:[place] I/O In: 3300 [I.V.:3000; IV Piggyback:300] Out: 1470 [Urine:150; Blood:1320]   Procedure:Ms. Stelzner is an 38 year old gravida 4 para 1102 at 28 weeks and 2 days estimated gestational age who presents for cesarean section. Following the appropriate informed consent the patient was brought to the operating room where spinal anesthesia was administered and found to be adequate. She was placed in the dorsal supine position with a leftward tilt. She was prepped and draped in the normal sterile fashion.  The patient was appropriately identified during a preoperative timeout procedure.  The scalpel was then used to make a Pfannenstiel skin incision which was carried down to the underlying layers of soft tissue to the fascia. The fascia was incised in the midline and the fascial incision was extended laterally with Mayo scissors. The superior aspect of the fascial incision was grasped with Coker clamps x2, tented up and the rectus muscles dissected off sharply with the electrocautery unit. The same procedure was repeated on the inferior aspect of the fascial  incision. The rectus muscles were separated in the midline. The abdominal peritoneum was identified, tented up, entered sharply, and the incision was extended superiorly and inferiorly with good visualization of the bladder. The Alexis retractor was then deployed. The vesicouterine peritoneum was identified, tented up, entered sharply, and the bladder flap was created digitally.  The scalpel was then used to make a low transverse incision on the uterus which was extended laterally with blunt dissection. The fetal vertex was identified, delivered easily through the uterine incision followed by the body. The infant was bulb suctioned on the operative field and cried vigorously.  Following a 1 minute delay, the cord was clamped and cut. The infant was passed to the waiting neonatology team. Placenta was then delivered spontaneously and the uterus was cleared of all clot and debris. The uterine incision was repaired with #1 chromic in running locked fashion. The ovaries and tubes were inspected and normal.  The left loping tube was identified and grasped with Babcock clamps x 2.  The LigaSure device was used to make successive bites of the mesosalpinx up to the level of the fallopian tube insertion into the fundus of the uterus.  The LigaSure was then used to ligate and excise the fallopian tube.  The mesosalpinx was inspected and found to be hemostatic.  The same procedure was repeated on the right fallopian tube.  The uterine incision was reinspected and confirmed to be hemostatic.The Alexis retractor was removed. The abdominal peritoneum was reapproximated with 2-0 Vicryl in a running fashion. The rectus muscles was reapproximated with 2-0 chromic in a running fashion. The fascia was closed with 0-looped PDS in a running fashion.  The subcutaneous tissue was reapproximated with 2-0 plain gut  sutures.  The skin was closed with 4-0 vicryl in a subcuticular fashion and Dermabond. All laparotomy sponge, instrument, and  needle counts were correct.  The patient tolerated the procedure well and was transferred to the recovery unit in stable condition following the procedure.

## 2024-02-08 NOTE — H&P (Signed)
 Amy Jackson is a 38 y.o. female presenting for repeat cesarean section and bilateral salpingectomy  38 year old gravida 4 para 1-1-1-2 at 39+2 presents for scheduled repeat cesarean section and bilateral salpingectomy for desired permanent sterilization.  Her pregnancy has been complicated by advanced maternal age and A2 gestational diabetes mellitus.  Blood sugars have been well-controlled on current regimen of Semglee  4 units every morning and 16 units nightly. Most recent US  for EFW.Ultrasound for EFW ultrasound EFW/AFI/BPP: Vertex, 3425 g (7 pounds 9 ounces) 83rd percentile. AFI 14. Fetal heart rate 143. BPP 8 out of 8 OB History     Gravida  4   Para  2   Term  1   Preterm  1   AB  1   Living  2      SAB  1   IAB      Ectopic      Multiple  0   Live Births  2          Past Medical History:  Diagnosis Date   Anxiety    Complication of anesthesia    spinal anes- unsuccessful for 1st CS   Depression    Family history of adverse reaction to anesthesia    mother - difficult intubation   Gestational diabetes    Kidney infection 04/19/2015   Past Surgical History:  Procedure Laterality Date   CESAREAN SECTION     x1   CESAREAN SECTION N/A 02/16/2015   Procedure: REPEAT CESAREAN SECTION;  Surgeon: Marjorie Gull, MD;  Location: [facility];  Service: Obstetrics;  Laterality: N/A;   DILATION AND CURETTAGE OF UTERUS     MOUTH SURGERY     Family History: family history includes Cancer in her maternal grandmother; Diabetes in her maternal grandmother and mother; Heart disease in her paternal grandmother; Hyperlipidemia in her father; Hypertension in her father and mother. Social History:  reports that she has never smoked. She has never used smokeless tobacco. She reports that she does not drink alcohol and does not use drugs.     Maternal Diabetes: Yes:  Diabetes Type:  Insulin /Medication controlled Genetic Screening: Normal Maternal Ultrasounds/Referrals:  Normal Fetal Ultrasounds or other Referrals:  None Maternal Substance Abuse:  No Significant Maternal Medications:  None Significant Maternal Lab Results:  None Number of Prenatal Visits:greater than 3 verified prenatal visits Maternal Vaccinations:TDap Other Comments:  None  Review of Systems History   Blood pressure (!) 140/91, pulse (!) 104, temperature 98.3 F (36.8 C), temperature source Oral, resp. rate 18, height 5' 5 (1.651 m), weight 96.1 kg, SpO2 97%, unknown if currently breastfeeding. Exam Physical Exam  Aox3, NAD Abd gravid, soft FHR 140 Prenatal labs: ABO, Rh: --/--/A POS (09/19 1047) Antibody: NEG (09/19 1047) Rubella: Immune (03/13 0000) RPR: NON REACTIVE (09/19 1100)  HBsAg: Negative (03/13 0000)  HIV: Non-reactive (03/13 0000)  GBS:   neg   Latest Reference Range & Units 02/05/24 11:00  WBC 4.0 - 10.5 K/uL 7.8  RBC 3.87 - 5.11 MIL/uL 4.04  Hemoglobin 12.0 - 15.0 g/dL 88.3 (L)  HCT 63.9 - 53.9 % 34.5 (L)  MCV 80.0 - 100.0 fL 85.4  MCH 26.0 - 34.0 pg 28.7  MCHC 30.0 - 36.0 g/dL 66.3  RDW 88.4 - 84.4 % 14.7  Platelets 150 - 400 K/uL 139 (L)  nRBC 0.0 - 0.2 % 0.0  RPR NON REACTIVE  NON REACTIVE     Assessment/Plan: 1) Admit 2) informed consent for repeat cesarean section and bilateral  salpingectomy obtained 3) SCDs for DVT prophylaxis 4) Ancef  2 g on-call to the OR   Marjorie Gull 02/08/2024, 11:40 AM

## 2024-02-08 NOTE — Transfer of Care (Signed)
 Immediate Anesthesia Transfer of Care Note  Patient: Amy Jackson  Procedure(s) Performed: CESAREAN SECTION, WITH BILATERAL TUBAL LIGATION  Patient Location: PACU  Anesthesia Type:Spinal  Level of Consciousness: awake  Airway & Oxygen Therapy: Patient Spontanous Breathing  Post-op Assessment: Report given to RN and Post -op Vital signs reviewed and stable  Post vital signs: Reviewed and stable  Last Vitals:  Vitals Value Taken Time  BP 104/76 02/08/24 13:54  Temp    Pulse 112 02/08/24 13:58  Resp 9 02/08/24 13:58  SpO2 96 % 02/08/24 13:58  Vitals shown include unfiled device data.  Last Pain:  Vitals:   02/08/24 1014  TempSrc: Oral      Patients Stated Pain Goal: 5 (02/08/24 1014)  Complications: No notable events documented.

## 2024-02-08 NOTE — Anesthesia Postprocedure Evaluation (Signed)
 Anesthesia Post Note  Patient: Amy Jackson  Procedure(s) Performed: CESAREAN SECTION, WITH BILATERAL TUBAL LIGATION     Patient location during evaluation: PACU Anesthesia Type: Spinal Level of consciousness: awake Pain management: pain level controlled Vital Signs Assessment: post-procedure vital signs reviewed and stable Respiratory status: spontaneous breathing, respiratory function stable and nonlabored ventilation Cardiovascular status: blood pressure returned to baseline and stable Postop Assessment: no headache, no backache and no apparent nausea or vomiting Anesthetic complications: no   No notable events documented.  Last Vitals:  Vitals:   02/08/24 1415 02/08/24 1430  BP: 123/86 139/86  Pulse: (!) 113 (!) 111  Resp: 20 18  Temp:    SpO2: 95% 95%    Last Pain:  Vitals:   02/08/24 1354  TempSrc: Oral    LLE Motor Response: Purposeful movement (02/08/24 1430) LLE Sensation: Tingling (02/08/24 1430) RLE Motor Response: Purposeful movement (02/08/24 1430) RLE Sensation: Tingling (02/08/24 1430)      Delon Aisha Arch

## 2024-02-08 NOTE — Anesthesia Procedure Notes (Signed)
 Spinal  Patient location during procedure: OR Start time: 02/08/2024 12:14 PM End time: 02/08/2024 12:17 PM Reason for block: surgical anesthesia Staffing Performed: anesthesiologist  Anesthesiologist: Peggye Delon Brunswick, MD Performed by: Peggye Delon Brunswick, MD Authorized by: Peggye Delon Brunswick, MD   Preanesthetic Checklist Completed: patient identified, IV checked, site marked, risks and benefits discussed, surgical consent, monitors and equipment checked, pre-op evaluation and timeout performed Spinal Block Patient position: sitting Prep: DuraPrep Patient monitoring: blood pressure and continuous pulse ox Approach: midline Location: L3-4 Injection technique: single-shot Needle Needle type: Pencan  Needle gauge: 24 G Needle length: 9 cm Assessment Sensory level: T4 Additional Notes Risks and benefits of neuraxial anesthesia including, but not limited to, infection, bleeding, local anesthetic toxicity, headache, hypotension, back pain, block failure, etc. were discussed with the patient. The patient expressed understanding and consented to the procedure. I confirmed that the patient has no bleeding disorders and is not taking blood thinners. I confirmed the patient's last platelet count with the nurse. Monitors were applied. A time-out was performed immediately prior to the procedure. Sterile technique was used throughout the whole procedure.   1 attempt(s)

## 2024-02-09 LAB — CBC
HCT: 28 % — ABNORMAL LOW (ref 36.0–46.0)
Hemoglobin: 9.3 g/dL — ABNORMAL LOW (ref 12.0–15.0)
MCH: 28.4 pg (ref 26.0–34.0)
MCHC: 33.2 g/dL (ref 30.0–36.0)
MCV: 85.6 fL (ref 80.0–100.0)
Platelets: 153 K/uL (ref 150–400)
RBC: 3.27 MIL/uL — ABNORMAL LOW (ref 3.87–5.11)
RDW: 14.7 % (ref 11.5–15.5)
WBC: 15.9 K/uL — ABNORMAL HIGH (ref 4.0–10.5)
nRBC: 0 % (ref 0.0–0.2)

## 2024-02-09 MED ORDER — OXYCODONE HCL 5 MG PO TABS
5.0000 mg | ORAL_TABLET | ORAL | Status: DC | PRN
  Administered 2024-02-09 (×2): 5 mg via ORAL
  Administered 2024-02-09 – 2024-02-11 (×7): 10 mg via ORAL
  Administered 2024-02-12: 5 mg via ORAL
  Filled 2024-02-09 (×3): qty 2
  Filled 2024-02-09: qty 1
  Filled 2024-02-09 (×4): qty 2
  Filled 2024-02-09 (×2): qty 1

## 2024-02-09 MED ORDER — IBUPROFEN 600 MG PO TABS
600.0000 mg | ORAL_TABLET | Freq: Four times a day (QID) | ORAL | Status: DC | PRN
  Administered 2024-02-09 – 2024-02-12 (×7): 600 mg via ORAL
  Filled 2024-02-09 (×9): qty 1

## 2024-02-09 NOTE — Progress Notes (Signed)
 Subjective: Postpartum Day 1: Cesarean Delivery Patient is doing well, has been in NICU with baby all morning. Pain is increased from yesterday but reports pain is controlled. Ambulating, voiding, tolerating PO. Minimal lochia.   Objective: Patient Vitals for the past 24 hrs:  BP Temp Temp src Pulse Resp SpO2  02/09/24 0800 119/78 98.7 F (37.1 C) Axillary (!) 108 18 99 %  02/09/24 0400 (!) 112/59 98 F (36.7 C) Oral (!) 50 18 98 %  02/09/24 0100 132/79 98 F (36.7 C) Oral (!) 50 18 99 %  02/08/24 1940 130/80 98.2 F (36.8 C) Oral (!) 52 18 99 %  02/08/24 1830 130/83 -- -- (!) 49 -- 98 %  02/08/24 1710 (!) 134/90 -- -- (!) 53 16 100 %  02/08/24 1614 123/77 97.7 F (36.5 C) Oral (!) 51 16 98 %  02/08/24 1502 (!) 126/90 97.6 F (36.4 C) Oral -- 16 98 %  02/08/24 1446 -- -- -- -- 15 --  02/08/24 1430 139/86 -- -- (!) 111 18 95 %  02/08/24 1415 123/86 -- -- (!) 113 20 95 %    Physical Exam:  General: alert, cooperative, and no distress Lochia: appropriate Uterine Fundus: firm Incision: healing well, no significant drainage, no dehiscence, no significant erythema DVT Evaluation: No evidence of DVT seen on physical exam.  Recent Labs    02/08/24 1524 02/09/24 0500  HGB 11.2* 9.3*  HCT 33.3* 28.0*    Assessment/Plan: KASIYAH PLATTER H5E7886 POD#1 sp RCS and bilateral salpingectomy at [redacted]w[redacted]d 1. PPC: routine PP care, encourage PO intake 2. Rh pos 3. A2GDM: check fasting sugar tomorrow 4. Intra-op EBL 1320, postop hgb 9.3, asymptomatic.    Rosaline FORBES Chapel 02/09/2024, 2:02 PM

## 2024-02-09 NOTE — Lactation Note (Signed)
 This note was copied from a baby's chart.  NICU Lactation Consultation Note  Patient Name: Amy Jackson Date: 02/09/2024 Age:38 hours  Reason for consult: Initial assessment; NICU baby; Term; Other (Comment); Maternal endocrine disorder (AMA, [employer] employee) Type of Endocrine Disorder?: Diabetes (gdma2 (semglee ))  SUBJECTIVE Visited with family of 38 42/35 weeks old NICU female; baby [name] got admitted due to respiratory distress. [name] is a P3 but not very experienced breastfeeding, she reported a Hx of low milk supply, she didn't even get engorged with her other babies; her children are now 72 and 41 y.o. She's already pumping and saw tiny droplets of colostrum on her first pumping session, praised her for all her efforts. Reviewed pumping schedule, pumping log, secretory activation, lactogenesis III, benefits of STS care, CDC and anticipatory guidelines. Left couplet bonding in the NICU, this is the first time [name] is holding baby.   OBJECTIVE Infant data: Mother's Current Feeding Choice: -- (NPO)  O2 Device: CPAP FiO2 (%): 30 %  Maternal data: [id number] C-Section, Low Transverse Has patient been taught Hand Expression?: Yes Hand Expression Comments: no colostrum noted at this time Significant Breast History:: minimal breast changes during the pregnancy Current breast feeding challenges:: NICU admission Previous breastfeeding challenges?: Low milk supply Does the patient have breastfeeding experience prior to this delivery?: Yes How long did the patient breastfeed?: 3 weeks; but that was 9 years ago Pumping frequency: initiated pumping at 6 hours post-partum Pumped volume: -- (droplets) Flange Size: 21 Risk factor for low/delayed milk supply:: GDMA2, PPH of 1320 cc, infant separation  Pump: Personal, DEBP (Spectra  S1 through insurance)  ASSESSMENT Infant: Feeding Status: NPO Feeding method: Tube/Gavage (Bolus)  Maternal: Milk volume:  Normal  INTERVENTIONS/PLAN Interventions: Interventions: Breast feeding basics reviewed; Skin to skin; Coconut oil; DEBP; Education; [employer] brochure; CDC milk storage guidelines; CDC Guidelines for Breast Pump Cleaning; NICU Pumping Log Tools: Pump; Flanges; Coconut oil Pump Education: Setup, frequency, and cleaning; Milk Storage  Plan: STS around care times Breast massage, hand expression and coconut oil prior to pumping Pump both breasts on initiate mode every 3 hours for 15 minutes, ideally 8 pumping sessions/24 hours  FOB present and supportive. All questions and concerns answered, family to contact [facility] services PRN.  Consult Status: NICU follow-up NICU Follow-up type: New admission follow up   Pressley Barsky S Athira Janowicz 02/09/2024, 11:08 AM

## 2024-02-10 LAB — SURGICAL PATHOLOGY

## 2024-02-10 LAB — GLUCOSE, CAPILLARY: Glucose-Capillary: 74 mg/dL (ref 70–99)

## 2024-02-10 MED ORDER — ACETAMINOPHEN 500 MG PO TABS
1000.0000 mg | ORAL_TABLET | Freq: Four times a day (QID) | ORAL | Status: DC
Start: 2024-02-10 — End: 2024-02-12
  Administered 2024-02-10 – 2024-02-12 (×9): 1000 mg via ORAL
  Filled 2024-02-10 (×9): qty 2

## 2024-02-10 NOTE — Clinical Social Work Maternal (Signed)
 CLINICAL SOCIAL WORK MATERNAL/CHILD NOTE  Patient Details  Name: Amy Jackson MRN: [id number] Date of Birth: 02/16/1986  Date:  02/10/2024  Clinical Social Worker Initiating Note:  Nat Bevely HUGHS Date/Time: Initiated:  02/10/24/1130     Child's Name:  Amy Jackson   Biological Parents:  Mother, Father (Mother: [name] 1985-10-09 Father: [name]  10/31/1982)   Need for Interpreter:  None   Reason for Referral:  Parental Support of Premature Babies < 32 weeks/or Critically Ill babies   Address:  [address] [place] [address]    Phone number:  [phone] (home)     Additional phone number:   Household Members/Support Persons (HM/SP):   Household Member/Support Person 1, Household Member/Support Person 2, Household Member/Support Person 3   HM/SP Name Relationship DOB or Age  HM/SP -1 [name] 10/31/1982  HM/SP -2 [name] Son 04/30/2009  HM/SP -3 [name] Daughter 02/16/2015  HM/SP -4        HM/SP -5        HM/SP -6        HM/SP -7        HM/SP -8          Natural Supports (not living in the home):  Immediate Family, Extended Family   Professional Supports: None   Employment: Full-time   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   Education:  Engineer, maintenance (IT)   Homebound arranged:    Surveyor, quantity Resources:  Media planner    Other Resources:      Cultural/Religious Considerations Which May Impact Care:    Strengths:  Ability to meet basic needs  , Home prepared for child  , Pediatrician chosen   Psychotropic Medications:         Pediatrician:    Armed forces operational officer area  Pediatrician List:   [facility]  [employer]    [facility]    [facility]      Pediatrician Fax Number:    Risk Factors/Current Problems:      Cognitive State:  Able to Concentrate  , Alert     Mood/Affect:  Calm  , Comfortable  ,  Interested     CSW Assessment: CSW received consult for Anxiety and NICU admission.  CSW met with MOB to offer support and complete assessment.   CSW met with MOB at bedside and introduced CSW role. MOB appeared calm and engaged during the visit. She confirmed that her demographic information on hospital file was correct and shared that she lives with her spouse and children. 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 She stated that she had all essential items to care for her infant including a car seat and crib and has chosen pediatrician at [employer]. CSW asked MOB how she had been feeling. MOB reported that she had been doing okay and expressed worry about her infant in the NICU. CSW validated and normalized her feelings. MOB shared that the NICU staff had updated her regularly about her infant's care. CSW discussed NICU support services and offered to check in with MOB to offer support while the infant remains in the NICU. MOB opted to call CSW if needs arise. CSW inquired about MOB's support system. MOB identified her spouse, two sisters, mother-in law as her primary sources of support.  CSW inquired about MOB's noted mental health history. MOB reported a prior diagnosis of  depression and anxiety. She noted that had taken mental health medication in the past to treat symptoms but was managing well without it and had no current concerns. CSW inquired if MOB experienced postpartum depression/anxiety symptoms after the births of her older children. MOB reported that she had postpartum depression symptoms with her previous child as she described as irritability. MOB reported that received care from her provider and was prescribed medication for her symptoms.  CSW provided education regarding the baby blues period vs. perinatal mood disorders, discussed treatment. MOB declined mental health resources and stated that she had access to mental health services through her  employers [employer]. CSW recommended MOB complete a self-evaluation during the postpartum time period using the New Mom Checklist from Postpartum Progress and encouraged MOB to contact a medical professional if symptoms are noted at any time.  CSW assessed MOB for safety. MOB denied SI/HI and DV concerns.  CSW provided review of Sudden Infant Death Syndrome (SIDS) precautions. MOB verbalized understanding.  CSW identifies no further need for intervention and no barriers to discharge at this time.  CSW Plan/Description:  Perinatal Mood and Anxiety Disorder (PMADs) Education, Sudden Infant Death Syndrome (SIDS) Education, No Further Intervention Required/No Barriers to Discharge    Nat DELENA Quiet, LCSW 02/10/2024, 1:37 PM

## 2024-02-10 NOTE — Lactation Note (Signed)
 This note was copied from a baby'Amy chart.  NICU Lactation Consultation Note  Patient Name: Amy Jackson Unijb'd Date: 02/10/2024 Age:38 hours  Reason for consult: Follow-up assessment; NICU baby; Other (Comment); Term (AMA, [facility] employee) Type of Endocrine Disorder?: Diabetes (GDMA2 (semglee ))  SUBJECTIVE Visited with family of 95 72/72 weeks old [facility] NICU female [name]; [name] is a P2 and reported she'Amy been pumping and getting droplets of colostrum, praised her for all her efforts. She voiced her nipples are getting sore (see maternal assessment) she'Amy using coconut oil prior to pumping, advised to start using her own EBM as well. # 21 flanges seem an appropriate fit as today, but let her know that she might need a smaller size once the swelling subsides. Noticed that pumping hasn't been consistent. She'Amy unsure of her discharge status, she'Amy a [employer] employee and wishes to stay as long as the insurance plan allows so she can be close to baby; he got intubated today. Reviewed discharge education just in case since NICU [facility] services won't be available till Friday, as well as pump settings and the importance of consistent pumping for the onset of lactogenesis II and the prevention of engorgement.   OBJECTIVE Infant data: Mother'Amy Current Feeding Choice: -- (NPO)  O2 Device: Ventilator O2 Flow Rate (L/min): 15 L/min FiO2 (%): 40 %  Infant feeding assessment IDFTS - Readiness: 5 (CPAP 5)   Maternal data: [id number] C-Section, Low Transverse Has patient been taught Hand Expression?: Yes Hand Expression Comments: no colostrum noted at this time Significant Breast History:: minimal breast changes during the pregnancy Current breast feeding challenges:: NICU admission Previous breastfeeding challenges?: Low milk supply Does the patient have breastfeeding experience prior to this delivery?: Yes How long did the patient breastfeed?: 3 weeks; but that was 9 years ago Pumping frequency: 4  times/24 hours Pumped volume: -- (drops) Flange Size: 21 Risk factor for low/delayed milk supply:: GDMA2, PPH of 1320 cc, infant separation  Pump: Personal, DEBP (Spectra  S1 through insurance)  ASSESSMENT Infant: Feeding Status: NPO Feeding method: Tube/Gavage (Bolus)  Maternal: Milk volume: Normal Both nipples are intact; no Amy/Amy of nipple trauma; probably experiencing transient soreness.  INTERVENTIONS/PLAN Interventions: Interventions: Breast feeding basics reviewed; Coconut oil; DEBP; Education Discharge Education: Engorgement and breast care Tools: Pump; Flanges; Coconut oil Pump Education: Setup, frequency, and cleaning; Milk Storage  Plan: STS once able to Breast massage, hand expression and coconut oil prior to pumping; use her own EBM post pumping Pump both breasts on initiate mode every 3 hours for 15 minutes, ideally 8 pumping sessions/24 hours Switch to maintain mode once expressing + 20 ml of EBM combined Bring all pump pieces to baby'Amy room after her discharge   FOB present and supportive. All questions and concerns answered, family to contact [facility] services PRN.  Consult Status: NICU follow-up NICU Follow-up type: Maternal D/C visit; Verify absence of engorgement; Verify onset of copious milk   Amy Jackson Amy Jackson Amy Jackson 02/10/2024, 6:07 PM

## 2024-02-10 NOTE — Progress Notes (Signed)
 Patient is doing well.  She is tolerating PO, ambulating, voiding.  Pain control is better, but still not optimal.  In review of meds, tylenol  was discontinued yesterday for unclear reason and she has not received her scheduled ibuprofen  sine 2200 last night.  Lochia is appropriate  Vitals:   02/09/24 1300 02/09/24 1320 02/09/24 1900 02/10/24 0522  BP: (!) 137/90 127/89 108/77 129/84  Pulse: (!) 102  (!) 108 99  Resp: 18  18 18   Temp: 98 F (36.7 C)   98.2 F (36.8 C)  TempSrc: Oral   Oral  SpO2:   99% 98%  Weight:      Height:        NAD Abdomen:  soft, appropriate tenderness, incisions intact and without erythema or drainage ext:    Symmetric, 1+ edema bilaterally  Lab Results  Component Value Date   WBC 15.9 (H) 02/09/2024   HGB 9.3 (L) 02/09/2024   HCT 28.0 (L) 02/09/2024   MCV 85.6 02/09/2024   PLT 153 02/09/2024    --/--/A POS (09/19 1047)  A/P    38 y.o. H5E7886 POD #2 s/p RCS/BS Routine post op and postpartum care.  Tylenol  1000 mg q6h reordered.  Reviewed goal of tylenol  + ibuprofen  scheduled q6h with oxycodone  prn.  Reviewed orders with nursing team. GDMA2: fasting BG 74 this AM.  Plan 2hr gtt pp  Intra-op EBL 1320, postop hgb 9.3, asymptomatic  Baby in NICU, will desire circumcision prior to discharge

## 2024-02-11 LAB — COMPREHENSIVE METABOLIC PANEL WITH GFR
ALT: 19 U/L (ref 0–44)
AST: 30 U/L (ref 15–41)
Albumin: 2.3 g/dL — ABNORMAL LOW (ref 3.5–5.0)
Alkaline Phosphatase: 111 U/L (ref 38–126)
Anion gap: 9 (ref 5–15)
BUN: 5 mg/dL — ABNORMAL LOW (ref 6–20)
CO2: 25 mmol/L (ref 22–32)
Calcium: 8.4 mg/dL — ABNORMAL LOW (ref 8.9–10.3)
Chloride: 106 mmol/L (ref 98–111)
Creatinine, Ser: 0.47 mg/dL (ref 0.44–1.00)
GFR, Estimated: >60 mL/min (ref >60)
Glucose, Bld: 87 mg/dL (ref 70–99)
Potassium: 4 mmol/L (ref 3.5–5.1)
Sodium: 140 mmol/L (ref 135–145)
Total Bilirubin: 0.7 mg/dL (ref 0.0–1.2)
Total Protein: 5.3 g/dL — ABNORMAL LOW (ref 6.5–8.1)

## 2024-02-11 LAB — CBC WITH DIFFERENTIAL/PLATELET
Abs Immature Granulocytes: 0.05 K/uL (ref 0.00–0.07)
Basophils Absolute: 0.1 K/uL (ref 0.0–0.1)
Basophils Relative: 1 %
Eosinophils Absolute: 0.2 K/uL (ref 0.0–0.5)
Eosinophils Relative: 2 %
HCT: 27.3 % — ABNORMAL LOW (ref 36.0–46.0)
Hemoglobin: 9.2 g/dL — ABNORMAL LOW (ref 12.0–15.0)
Immature Granulocytes: 1 %
Lymphocytes Relative: 14 %
Lymphs Abs: 1.3 K/uL (ref 0.7–4.0)
MCH: 28.8 pg (ref 26.0–34.0)
MCHC: 33.7 g/dL (ref 30.0–36.0)
MCV: 85.6 fL (ref 80.0–100.0)
Monocytes Absolute: 0.5 K/uL (ref 0.1–1.0)
Monocytes Relative: 6 %
Neutro Abs: 7.3 K/uL (ref 1.7–7.7)
Neutrophils Relative %: 76 %
Platelets: 173 K/uL (ref 150–400)
RBC: 3.19 MIL/uL — ABNORMAL LOW (ref 3.87–5.11)
RDW: 15.1 % (ref 11.5–15.5)
WBC: 9.5 K/uL (ref 4.0–10.5)
nRBC: 0 % (ref 0.0–0.2)

## 2024-02-11 MED ORDER — LABETALOL HCL 100 MG PO TABS
100.0000 mg | ORAL_TABLET | Freq: Two times a day (BID) | ORAL | Status: DC
  Administered 2024-02-11 – 2024-02-12 (×3): 100 mg via ORAL
  Filled 2024-02-11 (×3): qty 1

## 2024-02-11 MED ORDER — HYDROXYZINE HCL 25 MG PO TABS
25.0000 mg | ORAL_TABLET | Freq: Once | ORAL | Status: AC
  Administered 2024-02-11: 25 mg via ORAL
  Filled 2024-02-11: qty 1

## 2024-02-11 NOTE — Progress Notes (Signed)
 Subjective: Postpartum Day 3: Cesarean Delivery  Called by RN this morning. Patient reported HA this morning which resolved after ibuprofen  as well as palpitations. She has felt these before prior to pregnancy and was on metoprolol  for tachycardia/anxiety. Pt reports she had bigeminy and bradycardia during cesarean but recent HR 104. Last 2 BP elevated 149/86 and 150/99. Reports more LE edema.  Admits she is feeling stressed to because of son's NICU stay, need for intubation. Pain better controlled today. +flatus  Objective: Vital signs in last 24 hours: Temp:  [98 F (36.7 C)-98.2 F (36.8 C)] 98 F (36.7 C) (09/25 0734) Pulse Rate:  [83-113] 104 (09/25 0823) Resp:  [17-18] 18 (09/25 0734) BP: (120-150)/(77-99) 149/86 (09/25 0823) SpO2:  [97 %-99 %] 98 % (09/25 0823)  Physical Exam:  General: alert, cooperative, and no distress Lochia: appropriate Uterine Fundus: firm Incision: Honeycomb in place, no strikethrough DVT Evaluation: 2+ edema bilaterally, non-tender, no cords  Recent Labs    02/08/24 1524 02/09/24 0500  HGB 11.2* 9.3*  HCT 33.3* 28.0*    Assessment/Plan: 38 yo H5E7886 POD#3 s/p RCS and BS - PP: meeting goals - GHTN: BP elevated x 2, not 4 hours apart yet but given associated sx suspected GHTN. Will obtain labs and initiate low dose Labetalol  (no Procardia given recent HA and palpitation sx). Vitals q4 - Palpitations: suspected 2/2 anxiety, pt amenable to trial of vistaril  x 1 - Dispo: anticipate DC home tomorrow  Larraine DELENA Sharps, DO 02/11/2024, 10:27 AM

## 2024-02-11 NOTE — Lactation Note (Signed)
 This note was copied from a baby's chart. Lactation Consultation Note  Patient Name: Amy Jackson Unijb'd Date: 02/11/2024 Age:38 hours Reason for consult: Follow-up assessment;NICU baby;Maternal endocrine disorder  P3, Baby in NICU for respiratory distress and feeding difficulties.  Baby is now gavage feeding donor breastmilk.  Mother states she pumped 5 ml this morning. She states she may have turned up the suction too high when pumping because she is tender. Provided additional coconut oil.  Reminded mother to pump a minimum of 8 times in 24 hours for 15-20 min. Suggest calling if further assistance is needed. Reviewed engorgement care.   Maternal Data Has patient been taught Hand Expression?: Yes  Feeding Mother's Current Feeding Choice: Breast Milk and Donor Milk  Lactation Tools Discussed/Used Tools: Flanges;Pump;Coconut oil Flange Size: 21 Breast pump type: Double-Electric Breast Pump Reason for Pumping: infant separation Pumping frequency: Recommend q 3 hours for 15 min Pumped volume: 5 mL  Interventions Interventions: Education;Coconut oil  Discharge Discharge Education: Engorgement and breast care;Warning signs for feeding baby Pump: DEBP;Personal (Spectra )  Consult Status Consult Status: NICU follow-up Date: 02/12/24 Follow-up type: In-patient   Shannon Dines Boschen  RN, IBCLC 02/11/2024, 10:00 AM

## 2024-02-12 ENCOUNTER — Other Ambulatory Visit (HOSPITAL_COMMUNITY): Payer: Self-pay

## 2024-02-12 MED ORDER — IBUPROFEN 600 MG PO TABS: 600.0000 mg | ORAL_TABLET | Freq: Four times a day (QID) | ORAL | Status: DC | PRN

## 2024-02-12 MED ORDER — ACETAMINOPHEN 500 MG PO TABS: 1000.0000 mg | ORAL_TABLET | Freq: Four times a day (QID) | ORAL | Status: DC | PRN

## 2024-02-12 MED ORDER — OXYCODONE HCL 5 MG PO TABS
5.0000 mg | ORAL_TABLET | ORAL | 0 refills | Status: DC | PRN
  Filled 2024-02-12 (×2): qty 10, 1d supply, fill #0

## 2024-02-12 MED ORDER — LABETALOL HCL 100 MG PO TABS
100.0000 mg | ORAL_TABLET | Freq: Two times a day (BID) | ORAL | 0 refills | Status: DC
  Filled 2024-02-12 (×2): qty 60, 30d supply, fill #0

## 2024-02-12 MED ORDER — KETOROLAC TROMETHAMINE 30 MG/ML IJ SOLN
INTRAMUSCULAR | Status: AC
  Filled 2024-02-12: qty 1

## 2024-02-12 NOTE — Progress Notes (Addendum)
 Subjective: Postpartum Day 4: Cesarean Delivery Seen in the NICU. Doing well this morning. Pain is well controlled. Ambulating, voiding, and tolerating PO well. Palpitations resolved. Denies headache, vision changes, chest pain, shortness of breath, and right upper quadrant pain.   Objective:    02/12/2024    8:42 AM 02/12/2024    4:10 AM 02/11/2024   11:49 PM  Vitals with BMI  Systolic 133 134 866  Diastolic 79 72 75  Pulse 108 90      Physical Exam:  General: alert, cooperative, and no distress Lochia: appropriate Uterine Fundus: firm Incision: Honeycomb in place, no strikethrough DVT Evaluation: 2+ edema to lower shins bilaterally, non-tender, no cords  Recent Labs    02/11/24 1051  HGB 9.2*  HCT 27.3*    Assessment/Plan: 38 yo H5E7886 POD#4 s/p RCS and BS - PP: meeting goals, stable for discharge.  - GHTN: BP elevated x 2, not 4 hours apart but given associated sx, suspected GHTN. Normal PIH labs. Started on labetalol  100mg  BID yesterday; normotensive today. Plan outpatient BP check within a week.  - gDMA2: blood sugars normal since delivery. Plan postpartum f/u in clinic.  - Palpitations: resolved - Acute blood loss anemia, clinically significant for this hospitalization: Hgb 11.2-> 9.2. Plan outpatient PO iron.   Dispo: anticipate DC home today.  Amy DELENA Husky, MD 02/12/2024, 12:16 PM

## 2024-02-12 NOTE — Discharge Summary (Signed)
 Postpartum Discharge Summary  Date of Service updated 02/08/24-02/12/24     Patient Name: Amy Jackson DOB: Jul 26, 1985 MRN: [id number]  Date of admission: 02/08/2024 Delivery date:02/08/2024 Delivering provider: OKEY LEADER Date of discharge: 02/12/2024  Admitting diagnosis: Previous cesarean section [Z98.891] Term pregnancy [Z34.90] Cesarean delivery delivered [O82] Intrauterine pregnancy: [redacted]w[redacted]d     Secondary diagnosis:  Principal Problem:   Term pregnancy Active Problems:   Cesarean delivery delivered  Additional problems: gDMA2, h/o palpitations    Discharge diagnosis: Term Pregnancy Delivered, Gestational Hypertension, GDM A2, Anemia, and PPH                                              Post partum procedures:none Augmentation: N/A Complications: None  Hospital course: Scheduled C/S   38 y.o. yo H5E7886 at [redacted]w[redacted]d was admitted to the hospital 02/08/2024 for scheduled cesarean section with the following indication:Elective Repeat.Delivery details are as follows:  Membrane Rupture Time/Date: 12:45 PM,02/08/2024  Delivery Method:C-Section, Low Transverse Operative Delivery:N/A Details of operation can be found in separate operative note.  Patient had a postpartum course complicated by palpitations and the development of suspected gHTN.  She is ambulating, tolerating a regular diet, passing flatus, and urinating well. Patient is discharged home in stable condition on  02/12/24        Newborn Data: Birth date:02/08/2024 Birth time:12:46 PM Gender:Female Living status:Living Apgars:8 ,8  Weight:3770 g    Magnesium  Sulfate received: No BMZ received: No Rhophylac:No MMR:No T-DaP:Given prenatally Flu: No RSV Vaccine received: No Transfusion:No Immunizations administered: Immunization History  Administered Date(s) Administered   Influenza, Seasonal, Injecte, Preservative Fre 02/01/2024   Influenza,inj,Quad PF,6+ Mos 03/12/2018   Influenza-Unspecified 01/18/2016,  02/16/2017, 02/17/2019   MMR 02/18/2015   Tdap 11/22/2014   Unspecified SARS-COV-2 Vaccination 06/02/2019, 06/23/2019    Physical exam  Vitals:   02/11/24 2347 02/11/24 2349 02/12/24 0410 02/12/24 0842  BP: 133/75 133/75 134/72 133/79  Pulse: 99  90 (!) 108  Resp: 18  18   Temp: 98.3 F (36.8 C)  98.3 F (36.8 C)   TempSrc:      SpO2: 99%  100%   Weight:      Height:       General: alert, cooperative, and no distress Lochia: appropriate Uterine Fundus: firm Incision: Healing well with no significant drainage DVT Evaluation: No evidence of DVT seen on physical exam. Calf/Ankle edema is present Labs: Lab Results  Component Value Date   WBC 9.5 02/11/2024   HGB 9.2 (L) 02/11/2024   HCT 27.3 (L) 02/11/2024   MCV 85.6 02/11/2024   PLT 173 02/11/2024      Latest Ref Rng & Units 02/11/2024   10:51 AM  CMP  Glucose 70 - 99 mg/dL 87   BUN 6 - 20 mg/dL 5   Creatinine 9.55 - 8.99 mg/dL 9.52   Sodium 864 - 854 mmol/L 140   Potassium 3.5 - 5.1 mmol/L 4.0   Chloride 98 - 111 mmol/L 106   CO2 22 - 32 mmol/L 25   Calcium 8.9 - 10.3 mg/dL 8.4   Total Protein 6.5 - 8.1 g/dL 5.3   Total Bilirubin 0.0 - 1.2 mg/dL 0.7   Alkaline Phos 38 - 126 U/L 111   AST 15 - 41 U/L 30   ALT 0 - 44 U/L 19    Edinburgh Score:  02/09/2024   12:00 PM  Edinburgh Postnatal Depression Scale Screening Tool  I have been able to laugh and see the funny side of things. 0      After visit meds:  Allergies as of 02/12/2024       Reactions   Dilaudid [hydromorphone Hcl] Itching        Medication List     STOP taking these medications    freestyle lancets   FREESTYLE LITE test strip Generic drug: glucose blood   FreeStyle Lite w/Device Kit   insulin  glargine-yfgn 100 UNIT/ML Pen Commonly known as: Semglee  (yfgn)   Insupen Pen Needles 31G X 8 MM Misc Generic drug: Insulin  Pen Needle   Semglee  (yfgn) 100 UNIT/ML Pen Generic drug: insulin  glargine-yfgn       TAKE these  medications    acetaminophen  500 MG tablet Commonly known as: TYLENOL  Take 2 tablets (1,000 mg total) by mouth every 6 (six) hours as needed.   COMPLETENATE PO Take 1 tablet by mouth daily at 12 noon.   ibuprofen  600 MG tablet Commonly known as: ADVIL  Take 1 tablet (600 mg total) by mouth every 6 (six) hours as needed for cramping or moderate pain (pain score 4-6).   labetalol  100 MG tablet Commonly known as: NORMODYNE  Take 1 tablet (100 mg total) by mouth 2 (two) times daily.   oxyCODONE  5 MG immediate release tablet Commonly known as: Oxy IR/ROXICODONE  Take 1-2 tablets (5-10 mg total) by mouth every 4 (four) hours as needed for severe pain (pain score 7-10) or breakthrough pain.         Discharge home in stable condition Infant Feeding: intubated Infant Disposition:NICU Discharge instruction: per After Visit Summary and Postpartum booklet. Activity: Advance as tolerated. Pelvic rest for 6 weeks.  Diet: routine diet Anticipated Birth Control: salpingectomy performed at the time of C-section Postpartum Appointment:1 week Additional Postpartum F/U: Postpartum Depression checkup, 2 hour GTT, Incision check 1 week, and BP check 1 week Future Appointments:No future appointments. Follow up Visit:  Follow-up Information     Okey Leader, MD. Schedule an appointment as soon as possible for a visit in 1 week(s).   Specialty: Obstetrics and Gynecology Contact information: [address] [place] [address] [phone]                     02/12/2024 Rubie DELENA Husky, MD

## 2024-02-12 NOTE — Lactation Note (Signed)
 This note was copied from a baby's chart.  NICU Lactation Consultation Note  Patient Name: Amy Jackson Date: 02/12/2024 Age:38 days  Reason for consult: NICU baby; Term; Maternal endocrine disorder; Other (Comment); Maternal discharge ([employer] employee, AMA) Type of Endocrine Disorder?: Diabetes (GDMA2)  SUBJECTIVE Visited with family of 60 73/53 weeks old NICU female Amy Jackson; Amy Jackson is a P3 and reported she's been trying to follow the 3 hour schedule; her supply continues to slowly increase, praised her for all her efforts. She's getting discharged today. Reviewed discharge education, pump settings and the importance of consistent pumping for the onset of secretory activation and the prevention of engorgement. Discussed getting inserts for her Spectra  pump at home.  OBJECTIVE Infant data: Mother's Current Feeding Choice: Breast Milk and Donor Milk  O2 Device: Ventilator FiO2 (%): 21 %  Maternal data: H5E7886 C-Section, Low Transverse Has patient been taught Hand Expression?: Yes Pumping frequency: 5-6 times/24 hours Pumped volume: 10 mL Flange Size: 18 (Resized to # 18 flanges on 02/12/2024)  Pump: DEBP, Personal (Spectra )  ASSESSMENT Infant: Feeding Status: Scheduled 9-12-3-6 Feeding method: Tube/Gavage (Bolus)  Maternal: Milk volume: Normal  INTERVENTIONS/PLAN Interventions: Interventions: Breast feeding basics reviewed; Coconut oil; DEBP; Education Discharge Education: Engorgement and breast care Tools: Flanges  Plan: STS once able to Breast massage, hand expression and coconut oil prior to pumping; use her own EBM post pumping Pump both breasts on initiate mode every 3 hours for 15 minutes, ideally 8 pumping sessions/24 hours Switch to maintain mode once expressing + 20 ml of EBM combined Bring all pump pieces to baby's room after her discharge   FOB present and very supportive; he's been bringing the milk to the NICU. All questions and concerns  answered, family to contact [facility] services PRN.  Consult Status: NICU follow-up NICU Follow-up type: Verify onset of copious milk; Verify absence of engorgement   Witten Certain S Mellanie Bejarano 02/12/2024, 11:57 AM

## 2024-02-13 ENCOUNTER — Other Ambulatory Visit (HOSPITAL_COMMUNITY): Payer: Self-pay

## 2024-02-13 ENCOUNTER — Other Ambulatory Visit: Payer: Self-pay

## 2024-02-13 ENCOUNTER — Encounter (HOSPITAL_COMMUNITY): Payer: Self-pay | Admitting: Obstetrics and Gynecology

## 2024-02-13 ENCOUNTER — Inpatient Hospital Stay (HOSPITAL_COMMUNITY)
Admission: AD | Admit: 2024-02-13 | Discharge: 2024-02-13 | Disposition: A | Discharge status: Home / Self Care | Attending: Obstetrics and Gynecology | Admitting: Obstetrics and Gynecology

## 2024-02-13 DIAGNOSIS — O165 Unspecified maternal hypertension, complicating the puerperium: Secondary | ICD-10-CM

## 2024-02-13 DIAGNOSIS — O9089 Other complications of the puerperium, not elsewhere classified: Secondary | ICD-10-CM | POA: Insufficient documentation

## 2024-02-13 DIAGNOSIS — Z79899 Other long term (current) drug therapy: Secondary | ICD-10-CM | POA: Diagnosis not present

## 2024-02-13 DIAGNOSIS — G44209 Tension-type headache, unspecified, not intractable: Secondary | ICD-10-CM | POA: Diagnosis not present

## 2024-02-13 DIAGNOSIS — R519 Headache, unspecified: Secondary | ICD-10-CM | POA: Diagnosis present

## 2024-02-13 LAB — CBC
HCT: 28.4 % — ABNORMAL LOW (ref 36.0–46.0)
Hemoglobin: 9.3 g/dL — ABNORMAL LOW (ref 12.0–15.0)
MCH: 28.6 pg (ref 26.0–34.0)
MCHC: 32.7 g/dL (ref 30.0–36.0)
MCV: 87.4 fL (ref 80.0–100.0)
Platelets: 216 K/uL (ref 150–400)
RBC: 3.25 MIL/uL — ABNORMAL LOW (ref 3.87–5.11)
RDW: 15.1 % (ref 11.5–15.5)
WBC: 8.8 K/uL (ref 4.0–10.5)
nRBC: 0 % (ref 0.0–0.2)

## 2024-02-13 LAB — COMPREHENSIVE METABOLIC PANEL WITH GFR
ALT: 26 U/L (ref 0–44)
AST: 26 U/L (ref 15–41)
Albumin: 2.4 g/dL — ABNORMAL LOW (ref 3.5–5.0)
Alkaline Phosphatase: 108 U/L (ref 38–126)
Anion gap: 9 (ref 5–15)
BUN: 7 mg/dL (ref 6–20)
CO2: 24 mmol/L (ref 22–32)
Calcium: 8.3 mg/dL — ABNORMAL LOW (ref 8.9–10.3)
Chloride: 106 mmol/L (ref 98–111)
Creatinine, Ser: 0.47 mg/dL (ref 0.44–1.00)
GFR, Estimated: >60 mL/min (ref >60)
Glucose, Bld: 89 mg/dL (ref 70–99)
Potassium: 4.4 mmol/L (ref 3.5–5.1)
Sodium: 139 mmol/L (ref 135–145)
Total Bilirubin: 0.5 mg/dL (ref 0.0–1.2)
Total Protein: 5.4 g/dL — ABNORMAL LOW (ref 6.5–8.1)

## 2024-02-13 MED ORDER — IBUPROFEN 800 MG PO TABS
800.0000 mg | ORAL_TABLET | Freq: Once | ORAL | Status: AC
  Administered 2024-02-13: 800 mg via ORAL
  Filled 2024-02-13: qty 1

## 2024-02-13 MED ORDER — LABETALOL HCL 200 MG PO TABS
200.0000 mg | ORAL_TABLET | Freq: Two times a day (BID) | ORAL | 0 refills | Status: DC
  Filled 2024-02-13: qty 60, 30d supply, fill #0

## 2024-02-13 MED ORDER — LABETALOL HCL 100 MG PO TABS
100.0000 mg | ORAL_TABLET | Freq: Once | ORAL | Status: AC
  Administered 2024-02-13: 100 mg via ORAL
  Filled 2024-02-13: qty 1

## 2024-02-13 MED ORDER — FUROSEMIDE 20 MG PO TABS
20.0000 mg | ORAL_TABLET | Freq: Every day | ORAL | 0 refills | Status: DC
  Filled 2024-02-13: qty 5, 5d supply, fill #0

## 2024-02-13 NOTE — MAU Provider Note (Signed)
 History     CSN: [id number]  Arrival date and time: 02/13/24 9043   Event Date/Time   First Provider Initiated Contact with Patient 02/13/24 1025      Chief Complaint  Patient presents with   Headache   Hypertension   HPI  Amy Jackson is a 38 y.o. H5E7886 postpartum from a repeat cesarean section on 09/22 who presents for evaluation of headache and elevated blood pressure. She reports at 0400 she woke up with a headache. She did not take her blood pressure medication last night. She reports she took her blood pressure medication, ibuprofen  and oxycodone  at that time. She reports the pain resolved and she went back to sleep. She states the headache has since returned. Patient rates the pain as a 7/10 and has not tried anything for the pain. She denies any visual changes, epigastric pain. She reports lower extremity swelling but no worse than when she was inpatient. She has a hx of PVC that she used to take metoprolol  for but has not had any since she was in the OR.  OB History     Gravida  4   Para  3   Term  2   Preterm  1   AB  1   Living  3      SAB  1   IAB      Ectopic      Multiple  0   Live Births  3           Past Medical History:  Diagnosis Date   Anxiety    Complication of anesthesia    spinal anes- unsuccessful for 1st CS   Depression    Family history of adverse reaction to anesthesia    mother - difficult intubation   Gestational diabetes    Kidney infection 04/19/2015    Past Surgical History:  Procedure Laterality Date   CESAREAN SECTION     x1   CESAREAN SECTION N/A 02/16/2015   Procedure: REPEAT CESAREAN SECTION;  Surgeon: Marjorie Gull, MD;  Location: [facility];  Service: Obstetrics;  Laterality: N/A;   CESAREAN SECTION WITH BILATERAL TUBAL LIGATION N/A 02/08/2024   Procedure: CESAREAN SECTION, WITH BILATERAL TUBAL LIGATION;  Surgeon: Gull Marjorie, MD;  Location: [facility];  Service: Obstetrics;  Laterality: N/A;  SALPINGECTOMY    DILATION AND CURETTAGE OF UTERUS     MOUTH SURGERY      Family History  Problem Relation Age of Onset   Diabetes Mother    Hypertension Mother    Hyperlipidemia Father    Hypertension Father    Cancer Maternal Grandmother    Diabetes Maternal Grandmother    Heart disease Paternal Grandmother        MI    Social History   Tobacco Use   Smoking status: Never   Smokeless tobacco: Never  Vaping Use   Vaping status: Never Used  Substance Use Topics   Alcohol use: No   Drug use: No    Allergies:  Allergies  Allergen Reactions   Dilaudid [Hydromorphone Hcl] Itching    No medications prior to admission.    Review of Systems  Constitutional: Negative.  Negative for fatigue and fever.  HENT: Negative.    Respiratory: Negative.  Negative for shortness of breath.   Cardiovascular: Negative.  Negative for chest pain.  Gastrointestinal: Negative.  Negative for abdominal pain, constipation, diarrhea, nausea and vomiting.  Genitourinary: Negative.  Negative for dysuria.  Neurological: Negative.  Negative  for dizziness and headaches.   Physical Exam   Blood pressure (!) 147/83, pulse (!) 101, temperature 98.3 F (36.8 C), temperature source Oral, resp. rate 20, height 5' 5 (1.651 m), weight 90.1 kg, SpO2 97%, unknown if currently breastfeeding.  Patient Vitals for the past 24 hrs:  BP Temp Temp src Pulse Resp SpO2 Height Weight  02/13/24 1130 (!) 147/83 -- -- (!) 101 -- 97 % -- --  02/13/24 1116 (!) 149/74 -- -- 93 -- -- -- --  02/13/24 1115 (!) 149/74 -- -- 93 -- 97 % -- --  02/13/24 1102 134/77 -- -- 95 -- -- -- --  02/13/24 1045 133/87 -- -- (!) 102 -- 97 % -- --  02/13/24 1044 132/60 -- -- 100 -- -- -- --  02/13/24 1021 (!) 153/90 -- -- (!) 102 20 96 % -- --  02/13/24 1011 (!) 148/88 98.3 F (36.8 C) Oral (!) 103 19 98 % 5' 5 (1.651 m) 90.1 kg    Physical Exam Vitals and nursing note reviewed.  Constitutional:      General: She is not in acute distress.     Appearance: She is well-developed.  HENT:     Head: Normocephalic.  Eyes:     Pupils: Pupils are equal, round, and reactive to light.  Cardiovascular:     Rate and Rhythm: Normal rate and regular rhythm.     Heart sounds: Normal heart sounds.  Pulmonary:     Effort: Pulmonary effort is normal. No respiratory distress.     Breath sounds: Normal breath sounds.  Abdominal:     General: Bowel sounds are normal. There is no distension.     Palpations: Abdomen is soft.     Tenderness: There is no abdominal tenderness.  Skin:    General: Skin is warm and dry.  Neurological:     Mental Status: She is alert and oriented to person, place, and time.     Motor: No abnormal muscle tone.     Coordination: Coordination normal.     Deep Tendon Reflexes: Reflexes are normal and symmetric. Reflexes normal.  Psychiatric:        Behavior: Behavior normal.        Thought Content: Thought content normal.        Judgment: Judgment normal.     MAU Course  Procedures  Results for orders placed or performed during the hospital encounter of 02/13/24 (from the past 24 hours)  CBC     Status: Abnormal   Collection Time: 02/13/24 10:52 AM  Result Value Ref Range   WBC 8.8 4.0 - 10.5 K/uL   RBC 3.25 (L) 3.87 - 5.11 MIL/uL   Hemoglobin 9.3 (L) 12.0 - 15.0 g/dL   HCT 71.5 (L) 63.9 - 53.9 %   MCV 87.4 80.0 - 100.0 fL   MCH 28.6 26.0 - 34.0 pg   MCHC 32.7 30.0 - 36.0 g/dL   RDW 84.8 88.4 - 84.4 %   Platelets 216 150 - 400 K/uL   nRBC 0.0 0.0 - 0.2 %  Comprehensive metabolic panel     Status: Abnormal   Collection Time: 02/13/24 10:52 AM  Result Value Ref Range   Sodium 139 135 - 145 mmol/L   Potassium 4.4 3.5 - 5.1 mmol/L   Chloride 106 98 - 111 mmol/L   CO2 24 22 - 32 mmol/L   Glucose, Bld 89 70 - 99 mg/dL   BUN 7 6 - 20 mg/dL   Creatinine, Ser  0.47 0.44 - 1.00 mg/dL   Calcium 8.3 (L) 8.9 - 10.3 mg/dL   Total Protein 5.4 (L) 6.5 - 8.1 g/dL   Albumin 2.4 (L) 3.5 - 5.0 g/dL   AST 26 15 - 41  U/L   ALT 26 0 - 44 U/L   Alkaline Phosphatase 108 38 - 126 U/L   Total Bilirubin 0.5 0.0 - 1.2 mg/dL   GFR, Estimated >39 >39 mL/min   Anion gap 9 5 - 15      MDM Prenatal records from community office reviewed. Pregnancy complicated by A1GDM, postpartum HTN  Labs ordered and reviewed.   CBC, CMP ED EKG- normal with known PVC Ibuprofen  Labetalol    Patient reports complete resolution of headache. BP labile but no severe range and improved with medication. 2 instance of lower heart rate noted. Upon assessment, patient had pulse ox on same hand as BP cuff and episodes correlate with BP readings. Pulse ox moved and heart rate consistently in 100s.   Dr. Ford aware of plan of care. BP medication increased. Patient will call Monday for BP check to reevaluate dosing. BP medication kept as labetalol  due to hx of PVC and headache, patient states she was advised to not try nifedipine.    Assessment and Plan   1. Postpartum hypertension   2. Acute non intractable tension-type headache     -Discharge home in stable condition -Rx for labetalol  200mg  BID and lasix x5 days sent to pharmacy.  -Strict HTN precautions discussed -Patient advised to follow-up with OB Monday for BP check.  -Patient may return to MAU as needed or if her condition were to change or worsen  Aleck CHRISTELLA Fireman, CNM 02/13/2024, 10:25 AM

## 2024-02-13 NOTE — Lactation Note (Signed)
 This note was copied from a baby's chart.  NICU Lactation Consultation Note  Patient Name: Boy Isidra Mings Unijb'd Date: 02/13/2024 Age:38 days  Reason for consult: Follow-up assessment; NICU baby; Term; Maternal endocrine disorder; Other (Comment) ([employer] employee) Type of Endocrine Disorder?: Diabetes (GDMA2)  SUBJECTIVE Visited with family of 37 26/65 weeks old NICU female; Ms. Sortino is a P3 and went to MAU today due to hypertension. She reported she's been pumping and trying to follow the 3 hour-schedule but her supply has not increased since yesterday (see maternal assessment). She was prescribed lasix; her provided advised her to drink plenty of fluids. Revised strategies to increase supply such as STS care, using a hospital grade pump and power pumping.   OBJECTIVE Infant data: Mother's Current Feeding Choice: Breast Milk and Donor Milk  O2 Device: Other (Comment) (NIV NAVA) FiO2 (%): 21 %  Infant feeding assessment IDFTS - Readiness: 5 (NIV NAVA)   Maternal data: H5E7886 C-Section, Low Transverse Pumping frequency: 6 times/24 hours Pumped volume: 10 mL Flange Size: 18 (Resized to # 18 flanges on 02/12/2024)  Pump: DEBP, Personal (Spectra  S1 through insurance)  ASSESSMENT Infant: Feeding Status: Scheduled 9-12-3-6 Feeding method: Tube/Gavage (Bolus)  Maternal: Milk volume: Low No S/S of engorgement at this time  INTERVENTIONS/PLAN Interventions: Interventions: Breast feeding basics reviewed; DEBP; Coconut oil; Education Discharge Education: Engorgement and breast care Tools: Flanges  Plan: STS around care times Pump both breasts on maintain mode every 3 hours for 15-30 minutes, ideally 8 pumping sessions/24 hours Power pump once or twice a day   No other support person at this time, FOB left shortly before LC came in the room. All questions and concerns answered, family to contact [facility] services PRN.  Consult Status: NICU follow-up NICU Follow-up type: Weekly  NICU follow up   Tevin Shillingford S Miriam 02/13/2024, 1:07 PM

## 2024-02-13 NOTE — MAU Note (Signed)
 Amy Jackson is a 38 y.o. at Unknown here in MAU reporting: PP c/s on 02/08/24 baby is in NICU. Had a PPH and had heart palpitations in the OR. Discharged home yesterday. Woke up Thursday with a pounding headache and had an elevated BP that morning of 162/99. Started a low dose BP med but did not take her dose last night. Took her am dose at 0400. Woke up today with a terrible headache again and feels foggy like she can't think straight. Ongoing BLE swelling that has been present since delivery. Felt like she was wheezing and fluid in her lungs this morning. Took ibuprofen  this am at 0300.  Onset of complaint: ongoing Pain score: HA 6 Vitals:   02/13/24 1011  BP: (!) 148/88  Pulse: (!) 103  Resp: 19  Temp: 98.3 F (36.8 C)  SpO2: 98%

## 2024-02-16 ENCOUNTER — Inpatient Hospital Stay (HOSPITAL_COMMUNITY)
Admission: AD | Admit: 2024-02-16 | Discharge: 2024-02-17 | Discharge status: Against Medical Advice | Attending: Obstetrics and Gynecology | Admitting: Obstetrics and Gynecology

## 2024-02-16 ENCOUNTER — Encounter (HOSPITAL_COMMUNITY): Payer: Self-pay | Admitting: Obstetrics and Gynecology

## 2024-02-16 DIAGNOSIS — Z5321 Procedure and treatment not carried out due to patient leaving prior to being seen by health care provider: Secondary | ICD-10-CM | POA: Diagnosis not present

## 2024-02-16 DIAGNOSIS — O909 Complication of the puerperium, unspecified: Secondary | ICD-10-CM | POA: Diagnosis not present

## 2024-02-16 DIAGNOSIS — Z79899 Other long term (current) drug therapy: Secondary | ICD-10-CM | POA: Insufficient documentation

## 2024-02-16 DIAGNOSIS — O165 Unspecified maternal hypertension, complicating the puerperium: Secondary | ICD-10-CM | POA: Insufficient documentation

## 2024-02-16 DIAGNOSIS — I10 Essential (primary) hypertension: Secondary | ICD-10-CM | POA: Diagnosis present

## 2024-02-16 DIAGNOSIS — R519 Headache, unspecified: Secondary | ICD-10-CM | POA: Insufficient documentation

## 2024-02-16 DIAGNOSIS — R030 Elevated blood-pressure reading, without diagnosis of hypertension: Secondary | ICD-10-CM | POA: Diagnosis present

## 2024-02-16 LAB — CBC
HCT: 29.1 % — ABNORMAL LOW (ref 36.0–46.0)
Hemoglobin: 9.3 g/dL — ABNORMAL LOW (ref 12.0–15.0)
MCH: 27.9 pg (ref 26.0–34.0)
MCHC: 32 g/dL (ref 30.0–36.0)
MCV: 87.4 fL (ref 80.0–100.0)
Platelets: 283 K/uL (ref 150–400)
RBC: 3.33 MIL/uL — ABNORMAL LOW (ref 3.87–5.11)
RDW: 14.6 % (ref 11.5–15.5)
WBC: 7.8 K/uL (ref 4.0–10.5)
nRBC: 0 % (ref 0.0–0.2)

## 2024-02-16 NOTE — MAU Note (Signed)
 Amy Jackson is a 38 y.o. at Unknown here in MAU reporting having trouble with high b/p. She was seen here over the wkend with HTN and her b/p meds were increased. She has taken all meds today. Her b/p at home was 177/103. She reports having a h/a earlier today but does not have one now. PT states she feels foggy sometimes  LMP: na Onset of complaint: today Pain score: 0 Vitals:   02/16/24 2218  Pulse: 81  SpO2: 98%     FHT: na  Lab orders placed from triage: lab orders already in by provider

## 2024-02-17 ENCOUNTER — Other Ambulatory Visit (HOSPITAL_COMMUNITY): Payer: Self-pay

## 2024-02-17 LAB — COMPREHENSIVE METABOLIC PANEL WITH GFR
ALT: 26 U/L (ref 0–44)
AST: 26 U/L (ref 15–41)
Albumin: 2.6 g/dL — ABNORMAL LOW (ref 3.5–5.0)
Alkaline Phosphatase: 101 U/L (ref 38–126)
Anion gap: 11 (ref 5–15)
BUN: 10 mg/dL (ref 6–20)
CO2: 24 mmol/L (ref 22–32)
Calcium: 8.3 mg/dL — ABNORMAL LOW (ref 8.9–10.3)
Chloride: 107 mmol/L (ref 98–111)
Creatinine, Ser: 0.56 mg/dL (ref 0.44–1.00)
GFR, Estimated: >60 mL/min (ref >60)
Glucose, Bld: 97 mg/dL (ref 70–99)
Potassium: 3.8 mmol/L (ref 3.5–5.1)
Sodium: 142 mmol/L (ref 135–145)
Total Bilirubin: 0.5 mg/dL (ref 0.0–1.2)
Total Protein: 5.8 g/dL — ABNORMAL LOW (ref 6.5–8.1)

## 2024-02-17 MED ORDER — NIFEDIPINE ER OSMOTIC RELEASE 30 MG PO TB24
30.0000 mg | ORAL_TABLET | Freq: Every day | ORAL | 2 refills | Status: DC
  Filled 2024-02-17 (×3): qty 90, 90d supply, fill #0

## 2024-02-17 NOTE — MAU Note (Signed)
 PT states she has an appt later today with Dr Okey for B/P ck. She will just leave now and go to that appt. AMA form signed and pt left.

## 2024-02-18 ENCOUNTER — Telehealth: Payer: Self-pay | Admitting: Cardiology

## 2024-02-18 NOTE — Telephone Encounter (Signed)
 OB-Gyn referred patient back to Dr. Lavona for gestational (pregnancy-induced) hypertension without significant proteinuria, complicating the puerperium. Patient adamant about wanting to see Dr. Lavona only for follow up. Not interested in being seen by cardio-obstetrics clinic at all at this time. Patient has been scheduled for 10/17 with Dr. Beacher this acceptable or does she need to see Dr. Sheena for postpartum follow-up? Please address.

## 2024-02-19 ENCOUNTER — Telehealth (HOSPITAL_COMMUNITY): Payer: Self-pay | Admitting: *Deleted

## 2024-02-19 NOTE — Telephone Encounter (Signed)
 Pt scheduled to see Hochrein 10/9. Pt aware.

## 2024-02-19 NOTE — Telephone Encounter (Signed)
 02/19/2024  Name: Amy Jackson MRN: [id number] DOB: 03-29-1986  Reason for Call:  Transition of Care Hospital Discharge Call  Contact Status: Patient Contact Status: Complete  Language assistant needed: Interpreter Mode: Interpreter Not Needed        Follow-Up Questions: Do You Have Any Concerns About Your Health As You Heal From Delivery?: No Do You Have Any Concerns About Your Infants Health?: Infant in NICU  Edinburgh Postnatal Depression Scale:  In the Past 7 Days: I have been able to laugh and see the funny side of things.: As much as I always could I have looked forward with enjoyment to things.: As much as I ever did I have blamed myself unnecessarily when things went wrong.: Not very often I have been anxious or worried for no good reason.: No, not at all I have felt scared or panicky for no good reason.: No, not at all Things have been getting on top of me.: No, most of the time I have coped quite well I have been so unhappy that I have had difficulty sleeping.: Not at all I have felt sad or miserable.: No, not at all I have been so unhappy that I have been crying.: No, never The thought of harming myself has occurred to me.: Never Van Postnatal Depression Scale Total: 2  PHQ2-9 Depression Scale:     Discharge Follow-up: Edinburgh score requires follow up?: No Patient was advised of the following resources:: Support Group, Breastfeeding Support Group (declines postpartum group information via email)  Post-discharge interventions: NA  Mliss Sieve, RN 02/19/2024 11:49

## 2024-02-24 NOTE — Progress Notes (Unsigned)
  Cardiology Office Note:   Date:  02/25/2024  ID:  JARELIS EHLERT, DOB 09-04-1985, MRN [id number] PCP: Joesph Annabella HERO, FNP  [facility] Providers Cardiologist:  Lynwood Schilling, MD {  History of Present Illness:   Amy Jackson is a 38 y.o. female who is referred by Joesph Annabella HERO, FNP for evaluation of tachycardia.  She wore a monitor with sinus rhythm with  occasional sinus tachycardia.  This was ordered by her primary physician.  There were occasional isolated ventricular ectopic beats 1.6% of the time.  There were rare couplets.  She had a normal echo when I first saw her.     She has since had a high risk pregnancy because of advanced maternal age.  She had a baby on September 22.  She said during the C-section there was bigeminy.  She reports some increased blood loss.  Following the C-section in the hospital she had a headache and was found to be hypertensive.  She was treated with p.o. labetalol .  She still had elevated blood pressures and have Procardia added eventually.  Her blood pressures now seem to be coming down.  She has had increased lower extremity swelling.  She has felt a little short of breath lying flat and had a mildly elevated BNP 182.  She is not describing PND or orthopnea.  She is not having any chest pressure, neck or arm discomfort.  She has had no cough fevers or chills.   ROS: As stated in the HPI and negative for all other systems.  Studies Reviewed:    EKG:     02/13/2024, sinus rhythm, premature ventricular contractions, no acute ST-T wave changes.    Risk Assessment/Calculations:           Physical Exam:   VS:  BP 120/80   Pulse 81   Ht 5' 5 (1.651 m)   Wt 184 lb (83.5 kg)   SpO2 98%   Breastfeeding Yes   BMI 30.62 kg/m    Wt Readings from Last 3 Encounters:  02/25/24 184 lb (83.5 kg)  02/16/24 190 lb 14.4 oz (86.6 kg)  02/13/24 198 lb 11.2 oz (90.1 kg)     GEN: Well nourished, well developed in no acute distress NECK: No  JVD; No carotid bruits CARDIAC: RRR, no murmurs, rubs, gallops RESPIRATORY:  Clear to auscultation without rales, wheezing or rhonchi  ABDOMEN: Soft, non-tender, non-distended EXTREMITIES:  No edema; No deformity   ASSESSMENT AND PLAN:       Palpitations: I am going to have her wear a 2-week monitor to evaluate her arrhythmia.    I will check a TSH and mag.  For now she will continue the beta-blocker.   Hypertension:   Her blood pressure coming down.  I am to stop the Procardia.  I will continue the beta-blocker for now.  She will keep a blood pressure diary.  We talked about therapeutic lifestyle changes.    SOB:   I will check an echocardiogram.  We talked about salt restriction.  Her exam does not suggest volume overload.  Follow up with APP in  months.   Signed, Lynwood Schilling, MD

## 2024-02-25 ENCOUNTER — Ambulatory Visit: Attending: Cardiology | Admitting: Cardiology

## 2024-02-25 ENCOUNTER — Ambulatory Visit: Attending: Cardiology

## 2024-02-25 ENCOUNTER — Encounter: Payer: Self-pay | Admitting: Cardiology

## 2024-02-25 VITALS — BP 120/80 | HR 81 | Ht 65.0 in | Wt 184.0 lb

## 2024-02-25 DIAGNOSIS — R0602 Shortness of breath: Secondary | ICD-10-CM | POA: Diagnosis not present

## 2024-02-25 DIAGNOSIS — R002 Palpitations: Secondary | ICD-10-CM | POA: Diagnosis not present

## 2024-02-25 DIAGNOSIS — R000 Tachycardia, unspecified: Secondary | ICD-10-CM

## 2024-02-25 DIAGNOSIS — I10 Essential (primary) hypertension: Secondary | ICD-10-CM | POA: Diagnosis not present

## 2024-02-25 NOTE — Progress Notes (Unsigned)
 Enrolled patient for a 14 day Zio XT  monitor to be mailed to patients home

## 2024-02-25 NOTE — Patient Instructions (Addendum)
 Medication Instructions:  Stop PROCARDIA XL *If you need a refill on your cardiac medications before your next appointment, please call your pharmacy*  Lab Work: TSH and Magnesium  today at [facility] If you have labs (blood work) drawn today and your tests are completely normal, you will receive your results only by: MyChart Message (if you have MyChart) OR A paper copy in the mail If you have any lab test that is abnormal or we need to change your treatment, we will call you to review the results.  Testing/Procedures: 14 Day Zio Heart Monitor Your physician has requested that you wear a Zio heart monitor for __14___ days. This will be mailed to your home with instructions on how to apply the monitor and how to return it when finished. Please allow 2 weeks after returning the heart monitor before our office calls you with the results.   Echocardiogram Your physician has requested that you have an echocardiogram. Echocardiography is a painless test that uses sound waves to create images of your heart. It provides your doctor with information about the size and shape of your heart and how well your heart's chambers and valves are working. This procedure takes approximately one hour. There are no restrictions for this procedure. Please do NOT wear cologne, perfume, aftershave, or lotions (deodorant is allowed). Please arrive 15 minutes prior to your appointment time.  Please note: We ask at that you not bring children with you during ultrasound (echo/ vascular) testing. Due to room size and safety concerns, children are not allowed in the ultrasound rooms during exams. Our front office staff cannot provide observation of children in our lobby area while testing is being conducted. An adult accompanying a patient to their appointment will only be allowed in the ultrasound room at the discretion of the ultrasound technician under special circumstances. We apologize for any  inconvenience.   Follow-Up: At [facility], you and your health needs are our priority.  As part of our continuing mission to provide you with exceptional heart care, our providers are all part of one team.  This team includes your primary Cardiologist (physician) and Advanced Practice Providers or APPs (Physician Assistants and Nurse Practitioners) who all work together to provide you with the care you need, when you need it.  Your next appointment:   2 months  Provider:   One of our Advanced Practice Providers (APPs): Morse Clause, PA-C  Lamarr Satterfield, NP Miriam Shams, NP  Olivia Pavy, PA-C Josefa Beauvais, NP  Leontine Salen, PA-C Orren Fabry, PA-C  [place], PA-C Ernest Dick, NP  Damien Braver, NP Jon Hails, PA-C  Waddell Donath, PA-C    Dayna Dunn, PA-C  Scott Weaver, PA-C Lum Louis, NP Katlyn West, NP Callie Goodrich, PA-C  Xika Zhao, NP Sheng Haley, PA-C    Kathleen Johnson, PA-C    We recommend signing up for the patient portal called MyChart.  Sign up information is provided on this After Visit Summary.  MyChart is used to connect with patients for Virtual Visits (Telemedicine).  Patients are able to view lab/test results, encounter notes, upcoming appointments, etc.  Non-urgent messages can be sent to your provider as well.   To learn more about what you can do with MyChart, go to [email or web address].   Other Instructions ZIO XT- Long Term Monitor Instructions  Your physician has requested you wear a ZIO patch monitor for 14 days.  This is a single patch monitor. [employer] supplies one patch monitor per enrollment. Additional stickers  are not available. Please do not apply patch if you will be having a Nuclear Stress Test,  Echocardiogram, Cardiac CT, MRI, or Chest Xray during the period you would be wearing the  monitor. The patch cannot be worn during these tests. You cannot remove and re-apply the  ZIO XT patch monitor.  Your ZIO patch  monitor will be mailed 3 day USPS to your address on file. It may take 3-5 days  to receive your monitor after you have been enrolled.  Once you have received your monitor, please review the enclosed instructions. Your monitor  has already been registered assigning a specific monitor serial # to you.  Billing and Patient Assistance Program Information  We have supplied [employer] with any of your insurance information on file for billing purposes. [employer] offers a sliding scale Patient Assistance Program for patients that do not have  insurance, or whose insurance does not completely cover the cost of the ZIO monitor.  You must apply for the Patient Assistance Program to qualify for this discounted rate.  To apply, please call [employer] at [phone], select option 4, select option 2, ask to apply for  Patient Assistance Program. Meredeth will ask your household income, and how many people  are in your household. They will quote your out-of-pocket cost based on that information.  [employer] will also be able to set up a 45-month, interest-free payment plan if needed.  Applying the monitor   Shave hair from upper left chest.  Hold abrader disc by orange tab. Rub abrader in 40 strokes over the upper left chest as  indicated in your monitor instructions.  Clean area with 4 enclosed alcohol pads. Let dry.  Apply patch as indicated in monitor instructions. Patch will be placed under collarbone on left  side of chest with arrow pointing upward.  Rub patch adhesive wings for 2 minutes. Remove white label marked 1. Remove the white  label marked 2. Rub patch adhesive wings for 2 additional minutes.  While looking in a mirror, press and release button in center of patch. A small green light will  flash 3-4 times. This will be your only indicator that the monitor has been turned on.  Do not shower for the first 24 hours. You may shower after the first 24 hours.  Press the button if you feel a  symptom. You will hear a small click. Record Date, Time and  Symptom in the Patient Logbook.  When you are ready to remove the patch, follow instructions on the last 2 pages of Patient  Logbook. Stick patch monitor onto the last page of Patient Logbook.  Place Patient Logbook in the blue and white box. Use locking tab on box and tape box closed  securely. The blue and white box has prepaid postage on it. Please place it in the mailbox as  soon as possible. Your physician should have your test results approximately 7 days after the  monitor has been mailed back to [facility].  Call [facility] Customer Care at [phone] if you have questions regarding  your ZIO XT patch monitor. Call them immediately if you see an orange light blinking on your  monitor.  If your monitor falls off in less than 4 days, contact our Monitor department at [phone].  If your monitor becomes loose or falls off after 4 days call [employer] at [phone] for  suggestions on securing your monitor

## 2024-02-26 ENCOUNTER — Ambulatory Visit: Payer: Self-pay | Admitting: Cardiology

## 2024-02-26 LAB — MAGNESIUM: Magnesium: 2.1 mg/dL (ref 1.6–2.3)

## 2024-02-26 LAB — TSH: TSH: 0.831 u[IU]/mL (ref 0.450–4.500)

## 2024-03-04 ENCOUNTER — Ambulatory Visit: Admitting: Cardiology

## 2024-03-08 ENCOUNTER — Other Ambulatory Visit (HOSPITAL_COMMUNITY): Payer: Self-pay

## 2024-03-08 DIAGNOSIS — Z1331 Encounter for screening for depression: Secondary | ICD-10-CM | POA: Diagnosis not present

## 2024-03-08 MED ORDER — FLUOXETINE HCL 20 MG PO CAPS
20.0000 mg | ORAL_CAPSULE | Freq: Every day | ORAL | 4 refills | Status: AC
  Filled 2024-03-08: qty 90, 90d supply, fill #0
  Filled 2024-06-03 – 2024-06-17 (×2): qty 90, 90d supply, fill #1

## 2024-03-11 ENCOUNTER — Other Ambulatory Visit (HOSPITAL_COMMUNITY): Payer: Self-pay

## 2024-03-12 ENCOUNTER — Other Ambulatory Visit (HOSPITAL_COMMUNITY): Payer: Self-pay

## 2024-03-15 ENCOUNTER — Other Ambulatory Visit: Payer: Self-pay | Admitting: Cardiology

## 2024-03-15 ENCOUNTER — Other Ambulatory Visit: Payer: Self-pay

## 2024-03-15 ENCOUNTER — Other Ambulatory Visit (HOSPITAL_COMMUNITY): Payer: Self-pay

## 2024-03-15 NOTE — Telephone Encounter (Signed)
*  STAT* If patient is at the pharmacy, call can be transferred to refill team.   1. Which medications need to be refilled? (please list name of each medication and dose if known)   labetalol  (NORMODYNE ) 200 MG tablet    2. Which pharmacy/location (including street and city if local pharmacy) is medication to be sent to?  [id number] - [place], [place] - [address]      3. Do they need a 30 day or 90 day supply? 90 day    Pt is out of medication

## 2024-03-15 NOTE — Telephone Encounter (Signed)
 Pt is requesting a medication that was prescribed in the hospital, labetalol . Would Dr. Lavona like to refill this medication? Please address

## 2024-03-16 ENCOUNTER — Other Ambulatory Visit (HOSPITAL_COMMUNITY): Payer: Self-pay

## 2024-03-16 MED ORDER — LABETALOL HCL 200 MG PO TABS
200.0000 mg | ORAL_TABLET | Freq: Two times a day (BID) | ORAL | 3 refills | Status: DC
  Filled 2024-03-16 – 2024-03-25 (×2): qty 180, 90d supply, fill #0

## 2024-03-21 ENCOUNTER — Telehealth: Admitting: Physician Assistant

## 2024-03-21 DIAGNOSIS — R002 Palpitations: Secondary | ICD-10-CM

## 2024-03-21 DIAGNOSIS — J069 Acute upper respiratory infection, unspecified: Secondary | ICD-10-CM

## 2024-03-21 MED ORDER — LEVOCETIRIZINE DIHYDROCHLORIDE 5 MG PO TABS: 5.0000 mg | ORAL_TABLET | Freq: Every evening | ORAL | 0 refills | Status: DC

## 2024-03-21 MED ORDER — PROMETHAZINE-DM 6.25-15 MG/5ML PO SYRP: 5.0000 mL | ORAL_SOLUTION | Freq: Four times a day (QID) | ORAL | 0 refills | Status: AC | PRN

## 2024-03-21 MED ORDER — AZITHROMYCIN 250 MG PO TABS: ORAL_TABLET | ORAL | 0 refills | Status: AC

## 2024-03-21 MED ORDER — FLUTICASONE PROPIONATE 50 MCG/ACT NA SUSP: 2.0000 | Freq: Every day | NASAL | 6 refills | Status: DC

## 2024-03-21 NOTE — Progress Notes (Signed)

## 2024-03-21 NOTE — Addendum Note (Signed)
 Addended byBETHA ROLAN BERTHOLD on: 03/21/2024 02:09 PM   Modules accepted: Orders

## 2024-03-25 ENCOUNTER — Other Ambulatory Visit (HOSPITAL_COMMUNITY): Payer: Self-pay

## 2024-04-08 ENCOUNTER — Ambulatory Visit (HOSPITAL_COMMUNITY)
Admission: RE | Admit: 2024-04-08 | Discharge: 2024-04-08 | Disposition: A | Discharge status: Home / Self Care | Source: Ambulatory Visit | Attending: Cardiology | Admitting: Cardiology

## 2024-04-08 DIAGNOSIS — R0602 Shortness of breath: Secondary | ICD-10-CM | POA: Insufficient documentation

## 2024-04-08 LAB — ECHOCARDIOGRAM COMPLETE
Area-P 1/2: 3.39 cm2
Calc EF: 39.3 %
MV M vel: 5.04 m/s
MV Peak grad: 101.6 mmHg
S' Lateral: 3.7 cm
Single Plane A2C EF: 42.7 %
Single Plane A4C EF: 35.9 %

## 2024-04-12 ENCOUNTER — Encounter: Payer: Self-pay | Admitting: Cardiology

## 2024-04-12 DIAGNOSIS — I5021 Acute systolic (congestive) heart failure: Secondary | ICD-10-CM | POA: Insufficient documentation

## 2024-04-12 DIAGNOSIS — I10 Essential (primary) hypertension: Secondary | ICD-10-CM | POA: Insufficient documentation

## 2024-04-12 DIAGNOSIS — R002 Palpitations: Secondary | ICD-10-CM | POA: Insufficient documentation

## 2024-04-12 DIAGNOSIS — R0602 Shortness of breath: Secondary | ICD-10-CM | POA: Insufficient documentation

## 2024-04-12 NOTE — Progress Notes (Unsigned)
 Cardiology Office Note:   Date:  04/13/2024  ID:  CORYNN SOLBERG, DOB March 14, 1986, MRN [id number] PCP: Joesph Annabella HERO, FNP  [facility] Providers Cardiologist:  Lynwood Schilling, MD {  History of Present Illness:   Amy Jackson is a 38 y.o. female who is referred by Joesph Annabella HERO, FNP for evaluation of tachycardia.  She wore a monitor with sinus rhythm with  occasional sinus tachycardia.  This was ordered by her primary physician.  There were occasional isolated ventricular ectopic beats 1.6% of the time.  There were rare couplets.  She had a normal echo in 2024.    She had a high risk pregnancy because of advanced maternal age on February 08, 2024.  She said during the C-section there was bigeminy.  When I saw her recently she had increased BP and increased lower extremity swelling.  She had increased dyspnea.  I sent her for an echo in last week that demonstrated an EF of 45 - 50%.  There was mild MR.      Since I saw her she has had no new symptoms.  Her blood pressures have been reasonably well-controlled.  She is not having any new shortness of breath, PND or orthopnea.  She is not noticing any palpitations, presyncope or syncope.  She has had no weight gain or edema.  She has less edema but further she gets from having her most recent daily.   ROS: As stated in the HPI and negative for all other systems.  Studies Reviewed:    EKG:     02/13/2024, sinus rhythm, premature ventricular contractions, no acute ST-T wave changes.    Risk Assessment/Calculations:           Physical Exam:   VS:  BP (!) 160/104 (BP Location: Left Arm, Patient Position: Sitting, Cuff Size: Normal)   Pulse 92   Ht 5' 5 (1.651 m)   Wt 182 lb 12.8 oz (82.9 kg)   SpO2 98%   BMI 30.42 kg/m    Wt Readings from Last 3 Encounters:  04/13/24 182 lb 12.8 oz (82.9 kg)  02/25/24 184 lb (83.5 kg)  02/16/24 190 lb 14.4 oz (86.6 kg)     GEN: Well nourished, well developed in no acute  distress NECK: No JVD; No carotid bruits CARDIAC: RRR, no murmurs, rubs, gallops RESPIRATORY:  Clear to auscultation without rales, wheezing or rhonchi  ABDOMEN: Soft, non-tender, non-distended EXTREMITIES:  No edema; No deformity   ASSESSMENT AND PLAN:       Palpitations:     Monitor demonstrated no significant arrhythmias.  She did have 8% of her beats were PVCs.  I will be managing this in the context of treating her cardiomyopathy and likely will follow-up with repeat monitoring to quantify in the future.  Cardiomyopathy: My initial suspicion is that this could be a postpartum cardiomyopathy.  I am going to start to initiate GDMT and once this is completed we will follow-up with repeat echocardiography.  I will check an HIV.  If her EF remains low then we can exclude other causes though I have a low suspicion for ischemic heart disease.   Hypertension:   Her blood pressure is going to be controlled in the context of managing her cardiomyopathy.   Cardiomyopathy: As above her ejection fraction is globally mildly reduced.  Today I will change her from labetalol  to metoprolol  XL 100 mg daily.  Will have her come back in a couple of weeks likely to  start Entresto.  We can continue to see her routinely for the med titration.  Of note she is not breast-feeding and she had tubal ligation.  Follow up with APP in a couple of weeks.    Signed, Lynwood Schilling, MD

## 2024-04-13 ENCOUNTER — Other Ambulatory Visit (HOSPITAL_COMMUNITY): Payer: Self-pay

## 2024-04-13 ENCOUNTER — Encounter: Payer: Self-pay | Admitting: Cardiology

## 2024-04-13 ENCOUNTER — Ambulatory Visit: Attending: Cardiology | Admitting: Cardiology

## 2024-04-13 VITALS — BP 160/104 | HR 92 | Ht 65.0 in | Wt 182.8 lb

## 2024-04-13 DIAGNOSIS — I10 Essential (primary) hypertension: Secondary | ICD-10-CM | POA: Diagnosis not present

## 2024-04-13 DIAGNOSIS — I5021 Acute systolic (congestive) heart failure: Secondary | ICD-10-CM | POA: Diagnosis not present

## 2024-04-13 DIAGNOSIS — R0602 Shortness of breath: Secondary | ICD-10-CM | POA: Diagnosis not present

## 2024-04-13 DIAGNOSIS — R002 Palpitations: Secondary | ICD-10-CM

## 2024-04-13 MED ORDER — METOPROLOL SUCCINATE ER 100 MG PO TB24
100.0000 mg | ORAL_TABLET | Freq: Every day | ORAL | 3 refills | Status: DC
  Filled 2024-04-13: qty 90, 90d supply, fill #0

## 2024-04-13 NOTE — Patient Instructions (Addendum)
 Medication Instructions:  Stop Labetalol  Start Toprol  XL *If you need a refill on your cardiac medications before your next appointment, please call your pharmacy*  Lab Work: HIV test at [facility] today If you have labs (blood work) drawn today and your tests are completely normal, you will receive your results only by: MyChart Message (if you have MyChart) OR A paper copy in the mail If you have any lab test that is abnormal or we need to change your treatment, we will call you to review the results.  Testing/Procedures: NONE  Follow-Up: At [facility], you and your health needs are our priority.  As part of our continuing mission to provide you with exceptional heart care, our providers are all part of one team.  This team includes your primary Cardiologist (physician) and Advanced Practice Providers or APPs (Physician Assistants and Nurse Practitioners) who all work together to provide you with the care you need, when you need it.  Your next appointment:   As scheduled  Provider:   Devora, NP  We recommend signing up for the patient portal called MyChart.  Sign up information is provided on this After Visit Summary.  MyChart is used to connect with patients for Virtual Visits (Telemedicine).  Patients are able to view lab/test results, encounter notes, upcoming appointments, etc.  Non-urgent messages can be sent to your provider as well.   To learn more about what you can do with MyChart, go to [email or web address].

## 2024-04-14 LAB — HIV ANTIBODY (ROUTINE TESTING W REFLEX): HIV Screen 4th Generation wRfx: NONREACTIVE

## 2024-04-15 ENCOUNTER — Ambulatory Visit: Payer: Self-pay | Admitting: Cardiology

## 2024-04-25 NOTE — Progress Notes (Unsigned)
 Cardiology Clinic Note   Patient Name: Amy Jackson Date of Encounter: 04/25/2024  Primary Care Provider:  Joesph Annabella HERO, FNP Primary Cardiologist:  Lynwood Schilling, MD  Patient Profile    Amy Jackson 38 year old female presents to the clinic today for follow-up evaluation of her acute systolic CHF and palpitations.  Past Medical History    Past Medical History:  Diagnosis Date   Anxiety    Complication of anesthesia    spinal anes- unsuccessful for 1st CS   Depression    Essential hypertension 04/12/2024   Family history of adverse reaction to anesthesia    mother - difficult intubation   Gestational diabetes    Kidney infection 04/19/2015   Past Surgical History:  Procedure Laterality Date   CESAREAN SECTION     x1   CESAREAN SECTION N/A 02/16/2015   Procedure: REPEAT CESAREAN SECTION;  Surgeon: Marjorie Gull, MD;  Location: [facility];  Service: Obstetrics;  Laterality: N/A;   CESAREAN SECTION WITH BILATERAL TUBAL LIGATION N/A 02/08/2024   Procedure: CESAREAN SECTION, WITH BILATERAL TUBAL LIGATION;  Surgeon: Gull Marjorie, MD;  Location: [facility];  Service: Obstetrics;  Laterality: N/A;  SALPINGECTOMY   DILATION AND CURETTAGE OF UTERUS     MOUTH SURGERY      Allergies  Allergies  Allergen Reactions   Dilaudid [Hydromorphone Hcl] Itching    History of Present Illness    Amy Jackson has a PMH of acute systolic CHF, sinus tachycardia, palpitations, essential hypertension, and shortness of breath.  Her EKG 02/13/2024 showed normal sinus rhythm with PVCs and no acute ST or T wave changes.  Her PCP ordered a cardiac event monitor for palpitations and elevated heart rate.  She was noted to have sinus rhythm with occasional sinus tachycardia.  She was also noted to have occasional isolated ventricular ectopy 1.6% of the time.  She was noted to have rare couplets.  She had an echocardiogram in 2024 which showed normal EF.  She had a high risk pregnancy due to  advanced maternal age, 02/08/2024.  During C-section she was noted to have bigeminy.  She was seen by Dr. Schilling and was noted to have increased blood pressure.  She was also noted to have increased lower extremity swelling.  She reported increased dyspnea.  Repeat echocardiogram was ordered and showed an EF of 45-50% with mild MR.  She followed up with Dr. Schilling 04/13/2024.  During that time she denied new symptoms.  Her blood pressure was fairly well-controlled.  She had not been having new shortness of breath orthopnea or PND.  She denied palpitations, presyncope and syncope.  She denied weight gain and lower extremity swelling.  She was noticing less and less lower extremity swelling.  Her blood pressure was noted to be 160/104.  She was started on metoprolol  succinate 100 mg daily.  She presents to the clinic today for follow-up evaluation and states***.  *** denies chest pain, shortness of breath, lower extremity edema, fatigue, palpitations, melena, hematuria, hemoptysis, diaphoresis, weakness, presyncope, syncope, orthopnea, and PND.  Essential hypertension-BP today*** Maintain blood pressure log Heart healthy low-sodium diet Increase physical activity as tolerated Continue metoprolol  Nifedipine , verapamil, enalapril?  Acute systolic CHF, shortness of breath-no increased DOE or activity intolerance.  Euvolemic.  Weight stable.  Echocardiogram showed an LVEF of 45-50% and mild MR.  Details above. Heart healthy low-sodium diet Elevate lower extremities when not active Continue furosemide  as needed Lower extremity support stockings  Palpitations-heart rate today***.  Avoid triggers caffeine, chocolate, EtOH, dehydration excetra. Continue metoprolol  Maintain p.o. hydration  Disposition: Follow-up with Dr. Lavona or me in 6 months.  Home Medications    Prior to Admission medications   Medication Sig Start Date End Date Taking? Authorizing Provider  acetaminophen  (TYLENOL ) 500  MG tablet Take 2 tablets (1,000 mg total) by mouth every 6 (six) hours as needed. 02/12/24   Claire Rubie LABOR, MD  FLUoxetine  (PROZAC ) 20 MG capsule Take 1 capsule (20 mg total) by mouth daily. 03/08/24     fluticasone  (FLONASE ) 50 MCG/ACT nasal spray Place 2 sprays into both nostrils daily. Patient not taking: Reported on 04/13/2024 03/21/24   Rolan Berthold, PA-C  furosemide  (LASIX ) 20 MG tablet Take 1 tablet (20 mg total) by mouth daily. Patient not taking: Reported on 04/13/2024 02/13/24   Marylen Aleck HERO, CNM  ibuprofen  (ADVIL ) 600 MG tablet Take 1 tablet (600 mg total) by mouth every 6 (six) hours as needed for cramping or moderate pain (pain score 4-6). 02/12/24   Claire Rubie LABOR, MD  levocetirizine (XYZAL  ALLERGY 24HR) 5 MG tablet Take 1 tablet (5 mg total) by mouth every evening for 5 days. Patient not taking: Reported on 04/13/2024 03/21/24 03/26/24  McLean, Darius, PA-C  metoprolol  succinate (TOPROL -XL) 100 MG 24 hr tablet Take 1 tablet (100 mg total) by mouth daily. Take with or immediately following a meal. 04/13/24   Lavona Agent, MD  oxyCODONE  (OXY IR/ROXICODONE ) 5 MG immediate release tablet Take 1-2 tablets (5-10 mg total) by mouth every 4 (four) hours as needed for severe pain (pain score 7-10) or breakthrough pain. Patient not taking: Reported on 04/13/2024 02/12/24   Claire Rubie LABOR, MD  Prenatal Vit-Fe Fumarate-FA (COMPLETENATE PO) Take 1 tablet by mouth daily at 12 noon. Patient not taking: Reported on 04/13/2024    [provider]    Family History    Family History  Problem Relation Age of Onset   Diabetes Mother    Hypertension Mother    Hyperlipidemia Father    Hypertension Father    Cancer Maternal Grandmother    Diabetes Maternal Grandmother    Heart disease Paternal Grandmother        MI   She indicated that her mother is alive. She indicated that her father is alive. She indicated that her maternal grandmother is alive. She indicated that her  maternal grandfather is deceased. She indicated that her paternal grandmother is deceased. She indicated that her paternal grandfather is deceased.  Social History    Social History   Socioeconomic History   Marital status: Married    Spouse name: Not on file   Number of children: Not on file   Years of education: Not on file   Highest education level: Not on file  Occupational History   Not on file  Tobacco Use   Smoking status: Never   Smokeless tobacco: Never  Vaping Use   Vaping status: Never Used  Substance and Sexual Activity   Alcohol use: No   Drug use: No   Sexual activity: Yes    Birth control/protection: Pill  Other Topics Concern   Not on file  Social History Narrative   Not on file   Social Drivers of Health   Financial Resource Strain: Not on file  Food Insecurity: No Food Insecurity (02/08/2024)   Hunger Vital Sign    Worried About Running Out of Food in the Last Year: Never true    Ran Out of Food in the  Last Year: Never true  Transportation Needs: No Transportation Needs (02/08/2024)   PRAPARE - Administrator, Civil Service (Medical): No    Lack of Transportation (Non-Medical): No  Physical Activity: Not on file  Stress: Not on file  Social Connections: Not on file  Intimate Partner Violence: Not At Risk (02/08/2024)   Humiliation, Afraid, Rape, and Kick questionnaire    Fear of Current or Ex-Partner: No    Emotionally Abused: No    Physically Abused: No    Sexually Abused: No     Review of Systems    General:  No chills, fever, night sweats or weight changes.  Cardiovascular:  No chest pain, dyspnea on exertion, edema, orthopnea, palpitations, paroxysmal nocturnal dyspnea. Dermatological: No rash, lesions/masses Respiratory: No cough, dyspnea Urologic: No hematuria, dysuria Abdominal:   No nausea, vomiting, diarrhea, bright red blood per rectum, melena, or hematemesis Neurologic:  No visual changes, wkns, changes in mental  status. All other systems reviewed and are otherwise negative except as noted above.  Physical Exam    VS:  There were no vitals taken for this visit. , BMI There is no height or weight on file to calculate BMI. GEN: Well nourished, well developed, in no acute distress. HEENT: normal. Neck: Supple, no JVD, carotid bruits, or masses. Cardiac: RRR, no murmurs, rubs, or gallops. No clubbing, cyanosis, edema.  Radials/DP/PT 2+ and equal bilaterally.  Respiratory:  Respirations regular and unlabored, clear to auscultation bilaterally. GI: Soft, nontender, nondistended, BS + x 4. MS: no deformity or atrophy. Skin: warm and dry, no rash. Neuro:  Strength and sensation are intact. Psych: Normal affect.  Accessory Clinical Findings    Recent Labs: 02/16/2024: ALT 26; BUN 10; Creatinine, Ser 0.56; Hemoglobin 9.3; Platelets 283; Potassium 3.8; Sodium 142 02/25/2024: Magnesium  2.1; TSH 0.831   Recent Lipid Panel    Component Value Date/Time   CHOL 162 11/14/2021 1001   TRIG 123 11/14/2021 1001   HDL 57 11/14/2021 1001   CHOLHDL 2.8 11/14/2021 1001   LDLCALC 83 11/14/2021 1001    No BP recorded.  {Refresh Note OR Click here to enter BP  :1}***    ECG personally reviewed by me today- ***     Echocardiogram 04/08/2024  IMPRESSIONS     1. Left ventricular ejection fraction, by estimation, is 45 to 50%. The  left ventricle has mildly decreased function. The left ventricle  demonstrates global hypokinesis. Left ventricular diastolic parameters are  consistent with Grade I diastolic  dysfunction (impaired relaxation).   2. Right ventricular systolic function is normal. The right ventricular  size is normal.   3. Left atrial size was mildly dilated.   4. The mitral valve is normal in structure. Mild mitral valve  regurgitation. No evidence of mitral stenosis.   5. The aortic valve is tricuspid. Aortic valve regurgitation is not  visualized. No aortic stenosis is present.    Comparison(s): Prior images reviewed side by side. The left ventricular  function is worsened.   FINDINGS   Left Ventricle: Left ventricular ejection fraction, by estimation, is 45  to 50%. The left ventricle has mildly decreased function. The left  ventricle demonstrates global hypokinesis. 3D ejection fraction reviewed  and evaluated as part of the  interpretation. Alternate measurement of EF is felt to be most reflective  of LV function. The left ventricular internal cavity size was normal in  size. There is no left ventricular hypertrophy. Left ventricular diastolic  parameters are consistent with  Grade I diastolic dysfunction (impaired relaxation). Indeterminate filling  pressures.   Right Ventricle: The right ventricular size is normal. No increase in  right ventricular wall thickness. Right ventricular systolic function is  normal.   Left Atrium: Left atrial size was mildly dilated.   Right Atrium: Right atrial size was normal in size.   Pericardium: There is no evidence of pericardial effusion.   Mitral Valve: The mitral valve is normal in structure. Mild mitral valve  regurgitation. No evidence of mitral valve stenosis.   Tricuspid Valve: The tricuspid valve is normal in structure. Tricuspid  valve regurgitation is not demonstrated.   Aortic Valve: The aortic valve is tricuspid. Aortic valve regurgitation is  not visualized. No aortic stenosis is present.   Pulmonic Valve: The pulmonic valve was normal in structure. Pulmonic valve  regurgitation is not visualized.   Aorta: The aortic root and ascending aorta are structurally normal, with  no evidence of dilitation.   IAS/Shunts: No atrial level shunt detected by color flow Doppler.      Assessment & Plan   1.  ***   Josefa HERO. Trevaris Pennella NP-C     04/25/2024, 7:15 AM [facility] [address] [place], [place] [address] Office [phone]    Notice: This  dictation was prepared with Dragon dictation along with smaller phrase technology. Any transcriptional errors that result from this process are unintentional and may not be corrected upon review.   I spent***minutes examining this patient, reviewing medications, and using patient centered shared decision making involving their cardiac care.   I spent  20 minutes reviewing past medical history,  medications, and prior cardiac tests.

## 2024-04-26 ENCOUNTER — Ambulatory Visit: Admitting: Cardiology

## 2024-04-27 ENCOUNTER — Encounter: Payer: Self-pay | Admitting: General Practice

## 2024-04-27 ENCOUNTER — Other Ambulatory Visit (HOSPITAL_COMMUNITY): Payer: Self-pay

## 2024-04-27 ENCOUNTER — Other Ambulatory Visit: Payer: Self-pay

## 2024-04-27 ENCOUNTER — Ambulatory Visit: Attending: General Practice | Admitting: General Practice

## 2024-04-27 VITALS — BP 114/76 | HR 75 | Ht 65.0 in | Wt 184.0 lb

## 2024-04-27 DIAGNOSIS — I10 Essential (primary) hypertension: Secondary | ICD-10-CM | POA: Diagnosis not present

## 2024-04-27 DIAGNOSIS — R002 Palpitations: Secondary | ICD-10-CM

## 2024-04-27 DIAGNOSIS — I5021 Acute systolic (congestive) heart failure: Secondary | ICD-10-CM | POA: Diagnosis not present

## 2024-04-27 DIAGNOSIS — R0602 Shortness of breath: Secondary | ICD-10-CM | POA: Diagnosis not present

## 2024-04-27 MED ORDER — METOPROLOL SUCCINATE ER 50 MG PO TB24
50.0000 mg | ORAL_TABLET | Freq: Every day | ORAL | 1 refills | Status: AC
  Filled 2024-04-27 (×2): qty 90, 90d supply, fill #0

## 2024-04-27 MED ORDER — LOSARTAN POTASSIUM 25 MG PO TABS
12.5000 mg | ORAL_TABLET | Freq: Every day | ORAL | 1 refills | Status: DC
  Filled 2024-04-27 (×2): qty 45, 90d supply, fill #0

## 2024-04-27 NOTE — Patient Instructions (Addendum)
 Medication Instructions:  DECREASE Metoprolol  Succinate to 50mg  Take 1 tablet once a day  START Losartan  12.5mg  Take 1 tablet once a day *If you need a refill on your cardiac medications before your next appointment, please call your pharmacy*  Lab Work: One week BMET (05/04/2024) If you have labs (blood work) drawn today and your tests are completely normal, you will receive your results only by: MyChart Message (if you have MyChart) OR A paper copy in the mail If you have any lab test that is abnormal or we need to change your treatment, we will call you to review the results.  Testing/Procedures: None ordered  Follow-Up: At [facility], you and your health needs are our priority.  As part of our continuing mission to provide you with exceptional heart care, our providers are all part of one team.  This team includes your primary Cardiologist (physician) and Advanced Practice Providers or APPs (Physician Assistants and Nurse Practitioners) who all work together to provide you with the care you need, when you need it.  Your next appointment:   4-6 week(s)  Provider:   Josefa Beauvais, NP         We recommend signing up for the patient portal called MyChart.  Sign up information is provided on this After Visit Summary.  MyChart is used to connect with patients for Virtual Visits (Telemedicine).  Patients are able to view lab/test results, encounter notes, upcoming appointments, etc.  Non-urgent messages can be sent to your provider as well.   To learn more about what you can do with MyChart, go to [email or web address].   Other Instructions AVOID Alcohol and Caffeine How to stay hydrated  Drink water regularly: Sip water steadily throughout the day, even before you feel thirsty. A general goal is about 8 cups (64 ounces) per day, but individual needs vary based on age, activity level, and health. Drink with meals: Make it a habit to drink water with every meal. Keep  water handy: Carry a reusable water bottle with you to encourage consistent sipping. Hydrate around exercise: Drink water before, during, and after physical activity. Eat hydrating foods: Fruits, vegetables, and soups with broth also contribute to your fluid intake. Monitor your urine: Aim for a light yellow color, like lemonade.   Why hydration is important  Regulates body temperature: Water helps regulate your temperature through sweat production. Lubricates joints: It helps form the synovial fluid that lubricates your joints. Aids digestion: Water is crucial for moistening food and helping digestive enzymes work. Transports nutrients: Blood, which is largely water, transports oxygen and nutrients throughout the body. Boosts physical performance: Adequate hydration can help muscles work efficiently and prevent cramps, dizziness, and fatigue.   When to drink more fluids  During hot weather: You need to drink more when it's hot. During exercise: You lose fluids through sweat when you exercise. When you are sick: You may need extra fluids if you are ill. Consult a doctor: If you have a medical condition or take certain medications, your doctor can give you personalized advice on hydration.

## 2024-05-04 DIAGNOSIS — I5021 Acute systolic (congestive) heart failure: Secondary | ICD-10-CM | POA: Diagnosis not present

## 2024-05-04 DIAGNOSIS — R002 Palpitations: Secondary | ICD-10-CM | POA: Diagnosis not present

## 2024-05-04 DIAGNOSIS — R0602 Shortness of breath: Secondary | ICD-10-CM | POA: Diagnosis not present

## 2024-05-04 DIAGNOSIS — I10 Essential (primary) hypertension: Secondary | ICD-10-CM | POA: Diagnosis not present

## 2024-05-04 LAB — BASIC METABOLIC PANEL WITH GFR
BUN/Creatinine Ratio: 16 (ref 9–23)
BUN: 12 mg/dL (ref 6–20)
CO2: 25 mmol/L (ref 20–29)
Calcium: 9.1 mg/dL (ref 8.7–10.2)
Chloride: 101 mmol/L (ref 96–106)
Creatinine, Ser: 0.76 mg/dL (ref 0.57–1.00)
Glucose: 105 mg/dL — ABNORMAL HIGH (ref 70–99)
Potassium: 4.6 mmol/L (ref 3.5–5.2)
Sodium: 140 mmol/L (ref 134–144)
eGFR: 103 mL/min/1.73 (ref >59)

## 2024-05-05 ENCOUNTER — Ambulatory Visit: Payer: Self-pay | Admitting: General Practice

## 2024-05-24 NOTE — Progress Notes (Signed)
 "   Cardiology Clinic Note   Patient Name: Amy Jackson Date of Encounter: 05/26/2024  Primary Care Provider:  Joesph Annabella HERO, FNP Primary Cardiologist:  Lynwood Schilling, MD  Patient Profile    Amy Jackson 39 year old female presents to the clinic today for follow-up evaluation of her acute systolic CHF and palpitations.  Past Medical History    Past Medical History:  Diagnosis Date   Anxiety    Complication of anesthesia    spinal anes- unsuccessful for 1st CS   Depression    Essential hypertension 04/12/2024   Family history of adverse reaction to anesthesia    mother - difficult intubation   Gestational diabetes    Kidney infection 04/19/2015   Past Surgical History:  Procedure Laterality Date   CESAREAN SECTION     x1   CESAREAN SECTION N/A 02/16/2015   Procedure: REPEAT CESAREAN SECTION;  Surgeon: Marjorie Gull, MD;  Location: [facility];  Service: Obstetrics;  Laterality: N/A;   CESAREAN SECTION WITH BILATERAL TUBAL LIGATION N/A 02/08/2024   Procedure: CESAREAN SECTION, WITH BILATERAL TUBAL LIGATION;  Surgeon: Gull Marjorie, MD;  Location: [facility];  Service: Obstetrics;  Laterality: N/A;  SALPINGECTOMY   DILATION AND CURETTAGE OF UTERUS     MOUTH SURGERY      Allergies  Allergies  Allergen Reactions   Dilaudid [Hydromorphone Hcl] Itching    History of Present Illness    Amy Jackson has a PMH of acute systolic CHF, sinus tachycardia, palpitations, essential hypertension, and shortness of breath.  Her EKG 02/13/2024 showed normal sinus rhythm with PVCs and no acute ST or T wave changes.  Her PCP ordered a cardiac event monitor for palpitations and elevated heart rate.  She was noted to have sinus rhythm with occasional sinus tachycardia.  She was also noted to have occasional isolated ventricular ectopy 1.6% of the time.  She was noted to have rare couplets.  She had an echocardiogram in 2024 which showed normal EF.  She had a high risk pregnancy due to  advanced maternal age, 02/08/2024.  During C-section she was noted to have bigeminy.  She was seen by Dr. Schilling and was noted to have increased blood pressure.  She was also noted to have increased lower extremity swelling.  She reported increased dyspnea.  Repeat echocardiogram was ordered and showed an EF of 45-50% with mild MR.  She followed up with Dr. Schilling 04/13/2024.  During that time she denied new symptoms.  Her blood pressure was fairly well-controlled.  She had not been having new shortness of breath orthopnea or PND.  She denied palpitations, presyncope and syncope.  She denied weight gain and lower extremity swelling.  She was noticing less and less lower extremity swelling.  Her blood pressure was noted to be 160/104.  She was started on metoprolol  succinate 100 mg daily.  She presented to the clinic 04/27/24 for follow-up evaluation and stated she felt well.  Her blood pressure was 114/76.  We reviewed her hospitalization, delivery, and acute systolic CHF.  We reviewed heart failure and her specific case.  On recheck her blood pressure was 118/72.  I l reduced her metoprolol  to 50 mg and started losartan  12.5 mg.  I planned repeat BMP in 1 week and planned follow-up in 4 to 6 weeks.  I encouraged her to maintain her hydration, avoid caffeine, avoid chocolate, avoid EtOH and undue stress.  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  Her lab work was reassuring.  She presents to the clinic today for follow-up evaluation and states she is doing well.  She has back at work as buyer, retail at [employer].  She is tolerating this well.  She enjoyed her time off during the holidays.  She reports that her baby is doing well.  Her blood pressure today is 118/72.  She is tolerating losartan  well.  We reviewed her echocardiogram and goals for GDMT.  I will increase her losartan  to 25 mg daily, repeat BMP in a week and  plan echocardiogram at the first part of April.  Today she denies chest pain, shortness of breath, lower extremity edema, fatigue, palpitations.    Home Medications    Prior to Admission medications   Medication Sig Start Date End Date Taking? Authorizing Provider  acetaminophen  (TYLENOL ) 500 MG tablet Take 2 tablets (1,000 mg total) by mouth every 6 (six) hours as needed. 02/12/24   Claire Rubie LABOR, MD  FLUoxetine  (PROZAC ) 20 MG capsule Take 1 capsule (20 mg total) by mouth daily. 03/08/24     fluticasone  (FLONASE ) 50 MCG/ACT nasal spray Place 2 sprays into both nostrils daily. Patient not taking: Reported on 04/13/2024 03/21/24   Rolan Berthold, PA-C  furosemide  (LASIX ) 20 MG tablet Take 1 tablet (20 mg total) by mouth daily. Patient not taking: Reported on 04/13/2024 02/13/24   Marylen Aleck HERO, CNM  ibuprofen  (ADVIL ) 600 MG tablet Take 1 tablet (600 mg total) by mouth every 6 (six) hours as needed for cramping or moderate pain (pain score 4-6). 02/12/24   Claire Rubie LABOR, MD  levocetirizine (XYZAL  ALLERGY 24HR) 5 MG tablet Take 1 tablet (5 mg total) by mouth every evening for 5 days. Patient not taking: Reported on 04/13/2024 03/21/24 03/26/24  McLean, Darius, PA-C  metoprolol  succinate (TOPROL -XL) 100 MG 24 hr tablet Take 1 tablet (100 mg total) by mouth daily. Take with or immediately following a meal. 04/13/24   Lavona Agent, MD  oxyCODONE  (OXY IR/ROXICODONE ) 5 MG immediate release tablet Take 1-2 tablets (5-10 mg total) by mouth every 4 (four) hours as needed for severe pain (pain score 7-10) or breakthrough pain. Patient not taking: Reported on 04/13/2024 02/12/24   Claire Rubie LABOR, MD  Prenatal Vit-Fe Fumarate-FA (COMPLETENATE PO) Take 1 tablet by mouth daily at 12 noon. Patient not taking: Reported on 04/13/2024    [provider]    Family History    Family History  Problem Relation Age of Onset   Diabetes Mother    Hypertension Mother    Hyperlipidemia  Father    Hypertension Father    Cancer Maternal Grandmother    Diabetes Maternal Grandmother    Heart disease Paternal Grandmother        MI   She indicated that her mother is alive. She indicated that her father is alive. She indicated that her maternal grandmother is alive. She indicated that her maternal grandfather is deceased. She indicated that her paternal grandmother is deceased. She indicated that her paternal grandfather is deceased.  Social History    Social History   Socioeconomic History   Marital status: Married    Spouse name: Not on file   Number of children: Not on file   Years of education: Not on file   Highest education level: Not on file  Occupational History   Not on file  Tobacco Use   Smoking status: Never   Smokeless  tobacco: Never  Vaping Use   Vaping status: Never Used  Substance and Sexual Activity   Alcohol use: No   Drug use: No   Sexual activity: Yes    Birth control/protection: Pill  Other Topics Concern   Not on file  Social History Narrative   Not on file   Social Drivers of Health   Tobacco Use: Low Risk (05/26/2024)   Patient History    Smoking Tobacco Use: Never    Smokeless Tobacco Use: Never    Passive Exposure: Not on file  Financial Resource Strain: Not on file  Food Insecurity: No Food Insecurity (02/08/2024)   Epic    Worried About Programme Researcher, Broadcasting/film/video in the Last Year: Never true    Ran Out of Food in the Last Year: Never true  Transportation Needs: No Transportation Needs (02/08/2024)   Epic    Lack of Transportation (Medical): No    Lack of Transportation (Non-Medical): No  Physical Activity: Not on file  Stress: Not on file  Social Connections: Not on file  Intimate Partner Violence: Not At Risk (02/08/2024)   Epic    Fear of Current or Ex-Partner: No    Emotionally Abused: No    Physically Abused: No    Sexually Abused: No  Depression (PHQ2-9): Low Risk (12/16/2023)   Depression (PHQ2-9)    PHQ-2 Score: 0   Alcohol Screen: Not on file  Housing: Unknown (02/08/2024)   Epic    Unable to Pay for Housing in the Last Year: No    Number of Times Moved in the Last Year: Not on file    Homeless in the Last Year: No  Utilities: Not At Risk (02/08/2024)   Epic    Threatened with loss of utilities: No  Health Literacy: Not on file     Review of Systems    General:  No chills, fever, night sweats or weight changes.  Cardiovascular:  No chest pain, dyspnea on exertion, edema, orthopnea, palpitations, paroxysmal nocturnal dyspnea. Dermatological: No rash, lesions/masses Respiratory: No cough, dyspnea Urologic: No hematuria, dysuria Abdominal:   No nausea, vomiting, diarrhea, bright red blood per rectum, melena, or hematemesis Neurologic:  No visual changes, wkns, changes in mental status. All other systems reviewed and are otherwise negative except as noted above.  Physical Exam    VS:  BP 118/72 (BP Location: Left Arm, Patient Position: Sitting, Cuff Size: Normal)   Pulse 62   Ht 5' 5 (1.651 m)   Wt 186 lb (84.4 kg)   SpO2 97%   BMI 30.95 kg/m  , BMI Body mass index is 30.95 kg/m. GEN: Well nourished, well developed, in no acute distress. HEENT: normal. Neck: Supple, no JVD, carotid bruits, or masses. Cardiac: RRR, no murmurs, rubs, or gallops. No clubbing, cyanosis, edema.  Radials/DP/PT 2+ and equal bilaterally.  Respiratory:  Respirations regular and unlabored, clear to auscultation bilaterally. GI: Soft, nontender, nondistended, BS + x 4. MS: no deformity or atrophy. Skin: warm and dry, no rash. Neuro:  Strength and sensation are intact. Psych: Normal affect.  Accessory Clinical Findings    Recent Labs: 02/16/2024: ALT 26; Hemoglobin 9.3; Platelets 283 02/25/2024: Magnesium  2.1; TSH 0.831 05/04/2024: BUN 12; Creatinine, Ser 0.76; Potassium 4.6; Sodium 140   Recent Lipid Panel    Component Value Date/Time   CHOL 162 11/14/2021 1001   TRIG 123 11/14/2021 1001   HDL 57  11/14/2021 1001   CHOLHDL 2.8 11/14/2021 1001   LDLCALC 83 11/14/2021 1001  ECG personally reviewed by me today-none today.     Echocardiogram 04/08/2024  IMPRESSIONS     1. Left ventricular ejection fraction, by estimation, is 45 to 50%. The  left ventricle has mildly decreased function. The left ventricle  demonstrates global hypokinesis. Left ventricular diastolic parameters are  consistent with Grade I diastolic  dysfunction (impaired relaxation).   2. Right ventricular systolic function is normal. The right ventricular  size is normal.   3. Left atrial size was mildly dilated.   4. The mitral valve is normal in structure. Mild mitral valve  regurgitation. No evidence of mitral stenosis.   5. The aortic valve is tricuspid. Aortic valve regurgitation is not  visualized. No aortic stenosis is present.   Comparison(s): Prior images reviewed side by side. The left ventricular  function is worsened.   FINDINGS   Left Ventricle: Left ventricular ejection fraction, by estimation, is 45  to 50%. The left ventricle has mildly decreased function. The left  ventricle demonstrates global hypokinesis. 3D ejection fraction reviewed  and evaluated as part of the  interpretation. Alternate measurement of EF is felt to be most reflective  of LV function. The left ventricular internal cavity size was normal in  size. There is no left ventricular hypertrophy. Left ventricular diastolic  parameters are consistent with  Grade I diastolic dysfunction (impaired relaxation). Indeterminate filling  pressures.   Right Ventricle: The right ventricular size is normal. No increase in  right ventricular wall thickness. Right ventricular systolic function is  normal.   Left Atrium: Left atrial size was mildly dilated.   Right Atrium: Right atrial size was normal in size.   Pericardium: There is no evidence of pericardial effusion.   Mitral Valve: The mitral valve is normal in  structure. Mild mitral valve  regurgitation. No evidence of mitral valve stenosis.   Tricuspid Valve: The tricuspid valve is normal in structure. Tricuspid  valve regurgitation is not demonstrated.   Aortic Valve: The aortic valve is tricuspid. Aortic valve regurgitation is  not visualized. No aortic stenosis is present.   Pulmonic Valve: The pulmonic valve was normal in structure. Pulmonic valve  regurgitation is not visualized.   Aorta: The aortic root and ascending aorta are structurally normal, with  no evidence of dilitation.   IAS/Shunts: No atrial level shunt detected by color flow Doppler.      Assessment & Plan   1. Acute systolic CHF, shortness of breath-breathing stable.  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  Euvolemic.  Weight continues to be stable.  Echocardiogram showed an LVEF of 45-50% and mild MR.  Details above.  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 Heart healthy low-sodium diet-reviewed Elevate lower extremities when not active-reviewed Continue metoprolol ,  Increase losartan  25 mg daily Continue furosemide  as needed Plan for repeat echocardiogram at the beginning of April 2026 Repeat BMP in 1 week  Essential hypertension-BP today 118/72 Maintain blood pressure log Heart healthy low-sodium diet Increase physical activity as tolerated Continue metoprolol , losartan   Palpitations-heart rate today 62 bpm.  Denies recent palpitations. Avoid triggers caffeine, chocolate, EtOH, dehydration excetra.-Continue Continue metoprolol  Maintain p.o. hydration Maintain physical activity  Disposition: Follow-up with Dr. Lavona or me in 4 months.   Josefa HERO. Rhiannon Sassaman NP-C     05/26/2024, 1:57 PM [facility] [address] [place], [place] [address] Office [phone]    Notice:  This dictation was prepared with Dragon  dictation along with smaller phrase technology. Any transcriptional errors that result from this process are unintentional and may not be corrected upon review.   I spent 14 minutes examining this patient, reviewing medications, and using patient centered shared decision making involving their cardiac care.   I spent  20 minutes reviewing past medical history,  medications, and prior cardiac tests.  "

## 2024-05-26 ENCOUNTER — Ambulatory Visit: Attending: General Practice | Admitting: General Practice

## 2024-05-26 ENCOUNTER — Encounter: Payer: Self-pay | Admitting: General Practice

## 2024-05-26 ENCOUNTER — Other Ambulatory Visit (HOSPITAL_COMMUNITY): Payer: Self-pay

## 2024-05-26 VITALS — BP 118/72 | HR 62 | Ht 65.0 in | Wt 186.0 lb

## 2024-05-26 DIAGNOSIS — R002 Palpitations: Secondary | ICD-10-CM

## 2024-05-26 DIAGNOSIS — I10 Essential (primary) hypertension: Secondary | ICD-10-CM

## 2024-05-26 DIAGNOSIS — I5021 Acute systolic (congestive) heart failure: Secondary | ICD-10-CM

## 2024-05-26 MED ORDER — LOSARTAN POTASSIUM 25 MG PO TABS
25.0000 mg | ORAL_TABLET | Freq: Every day | ORAL | 3 refills | Status: AC
  Filled 2024-05-26 – 2024-06-03 (×2): qty 90, 90d supply, fill #0

## 2024-05-26 NOTE — Patient Instructions (Signed)
 Medication Instructions:  Increase Losartan  25 mg daily  *If you need a refill on your cardiac medications before your next appointment, please call your pharmacy*  Lab Work: BMET in 1 week  Testing/Procedures: Your physician has requested that you have an echocardiogram April 2026. Echocardiography is a painless test that uses sound waves to create images of your heart. It provides your doctor with information about the size and shape of your heart and how well your hearts chambers and valves are working. This procedure takes approximately one hour. There are no restrictions for this procedure. Please do NOT wear cologne, perfume, aftershave, or lotions (deodorant is allowed). Please arrive 15 minutes prior to your appointment time.  Please note: We ask at that you not bring children with you during ultrasound (echo/ vascular) testing. Due to room size and safety concerns, children are not allowed in the ultrasound rooms during exams. Our front office staff cannot provide observation of children in our lobby area while testing is being conducted. An adult accompanying a patient to their appointment will only be allowed in the ultrasound room at the discretion of the ultrasound technician under special circumstances. We apologize for any inconvenience.   Follow-Up: At [facility], you and your health needs are our priority.  As part of our continuing mission to provide you with exceptional heart care, our providers are all part of one team.  This team includes your primary Cardiologist (physician) and Advanced Practice Providers or APPs (Physician Assistants and Nurse Practitioners) who all work together to provide you with the care you need, when you need it.  Your next appointment:    May or June 2026  Provider:   Lynwood Schilling, MD or Josefa Beauvais, NP          We recommend signing up for the patient portal called MyChart.  Sign up information is provided on this After Visit  Summary.  MyChart is used to connect with patients for Virtual Visits (Telemedicine).  Patients are able to view lab/test results, encounter notes, upcoming appointments, etc.  Non-urgent messages can be sent to your provider as well.   To learn more about what you can do with MyChart, go to [email or web address].

## 2024-05-29 ENCOUNTER — Other Ambulatory Visit (HOSPITAL_COMMUNITY): Payer: Self-pay

## 2024-05-31 LAB — BASIC METABOLIC PANEL WITH GFR
BUN/Creatinine Ratio: 16 (ref 9–23)
BUN: 10 mg/dL (ref 6–20)
CO2: 21 mmol/L (ref 20–29)
Calcium: 9.5 mg/dL (ref 8.7–10.2)
Chloride: 104 mmol/L (ref 96–106)
Creatinine, Ser: 0.63 mg/dL (ref 0.57–1.00)
Glucose: 89 mg/dL (ref 70–99)
Potassium: 4.5 mmol/L (ref 3.5–5.2)
Sodium: 141 mmol/L (ref 134–144)
eGFR: 116 mL/min/1.73

## 2024-06-01 ENCOUNTER — Ambulatory Visit: Payer: Self-pay | Admitting: General Practice

## 2024-06-03 ENCOUNTER — Other Ambulatory Visit (HOSPITAL_COMMUNITY): Payer: Self-pay

## 2024-06-03 ENCOUNTER — Other Ambulatory Visit: Payer: Self-pay

## 2024-06-06 ENCOUNTER — Other Ambulatory Visit (HOSPITAL_COMMUNITY): Payer: Self-pay

## 2024-06-08 ENCOUNTER — Other Ambulatory Visit: Payer: Self-pay

## 2024-06-09 ENCOUNTER — Other Ambulatory Visit (HOSPITAL_COMMUNITY): Payer: Self-pay

## 2024-06-17 ENCOUNTER — Other Ambulatory Visit: Payer: Self-pay

## 2024-06-24 ENCOUNTER — Other Ambulatory Visit: Payer: Self-pay

## 2024-08-23 ENCOUNTER — Ambulatory Visit (HOSPITAL_COMMUNITY)
